# Patient Record
Sex: Female | Born: 1955 | Race: Black or African American | Hispanic: No | Marital: Married | Smoking: Never smoker
Health system: Southern US, Community
[De-identification: ages and names within clinical notes are randomized; demographics above are authoritative.]

## PROBLEM LIST (undated history)

## (undated) DIAGNOSIS — Z923 Personal history of irradiation: Secondary | ICD-10-CM

## (undated) DIAGNOSIS — Z8489 Family history of other specified conditions: Secondary | ICD-10-CM

## (undated) DIAGNOSIS — C801 Malignant (primary) neoplasm, unspecified: Secondary | ICD-10-CM

## (undated) DIAGNOSIS — M1990 Unspecified osteoarthritis, unspecified site: Secondary | ICD-10-CM

## (undated) HISTORY — PX: BIOPSY THYROID: PRO38

## (undated) HISTORY — PX: OTHER SURGICAL HISTORY: SHX169

## (undated) HISTORY — DX: Malignant (primary) neoplasm, unspecified: C80.1

---

## 1998-07-05 ENCOUNTER — Ambulatory Visit (HOSPITAL_COMMUNITY): Admission: RE | Admit: 1998-07-05 | Discharge: 1998-07-05 | Payer: Self-pay | Admitting: Obstetrics and Gynecology

## 1998-07-05 ENCOUNTER — Other Ambulatory Visit: Admission: RE | Admit: 1998-07-05 | Discharge: 1998-07-05 | Payer: Self-pay | Admitting: Obstetrics and Gynecology

## 1998-07-05 ENCOUNTER — Encounter: Payer: Self-pay | Admitting: Obstetrics and Gynecology

## 1998-10-11 ENCOUNTER — Other Ambulatory Visit: Admission: RE | Admit: 1998-10-11 | Discharge: 1998-10-11 | Payer: Self-pay | Admitting: Obstetrics and Gynecology

## 1999-07-04 ENCOUNTER — Ambulatory Visit (HOSPITAL_COMMUNITY): Admission: RE | Admit: 1999-07-04 | Discharge: 1999-07-04 | Payer: Self-pay | Admitting: Obstetrics and Gynecology

## 1999-07-04 ENCOUNTER — Encounter: Payer: Self-pay | Admitting: Obstetrics and Gynecology

## 1999-07-07 ENCOUNTER — Ambulatory Visit (HOSPITAL_COMMUNITY): Admission: RE | Admit: 1999-07-07 | Discharge: 1999-07-07 | Payer: Self-pay | Admitting: Obstetrics and Gynecology

## 1999-07-07 ENCOUNTER — Encounter: Payer: Self-pay | Admitting: Obstetrics and Gynecology

## 1999-11-21 ENCOUNTER — Other Ambulatory Visit: Admission: RE | Admit: 1999-11-21 | Discharge: 1999-11-21 | Payer: Self-pay | Admitting: Obstetrics and Gynecology

## 2000-08-09 ENCOUNTER — Ambulatory Visit (HOSPITAL_COMMUNITY): Admission: RE | Admit: 2000-08-09 | Discharge: 2000-08-09 | Payer: Self-pay | Admitting: Obstetrics and Gynecology

## 2000-08-09 ENCOUNTER — Encounter: Payer: Self-pay | Admitting: Obstetrics and Gynecology

## 2001-04-01 ENCOUNTER — Other Ambulatory Visit: Admission: RE | Admit: 2001-04-01 | Discharge: 2001-04-01 | Payer: Self-pay | Admitting: Obstetrics and Gynecology

## 2001-09-02 ENCOUNTER — Ambulatory Visit (HOSPITAL_COMMUNITY): Admission: RE | Admit: 2001-09-02 | Discharge: 2001-09-02 | Payer: Self-pay | Admitting: Obstetrics and Gynecology

## 2001-09-02 ENCOUNTER — Encounter: Payer: Self-pay | Admitting: Obstetrics and Gynecology

## 2002-08-25 ENCOUNTER — Other Ambulatory Visit: Admission: RE | Admit: 2002-08-25 | Discharge: 2002-08-25 | Payer: Self-pay | Admitting: Obstetrics and Gynecology

## 2002-09-10 ENCOUNTER — Encounter: Payer: Self-pay | Admitting: Obstetrics and Gynecology

## 2002-09-10 ENCOUNTER — Ambulatory Visit (HOSPITAL_COMMUNITY): Admission: RE | Admit: 2002-09-10 | Discharge: 2002-09-10 | Payer: Self-pay | Admitting: Obstetrics and Gynecology

## 2003-10-05 ENCOUNTER — Ambulatory Visit (HOSPITAL_COMMUNITY): Admission: RE | Admit: 2003-10-05 | Discharge: 2003-10-05 | Payer: Self-pay | Admitting: Obstetrics and Gynecology

## 2004-01-27 ENCOUNTER — Other Ambulatory Visit: Admission: RE | Admit: 2004-01-27 | Discharge: 2004-01-27 | Payer: Self-pay | Admitting: Obstetrics and Gynecology

## 2004-10-05 ENCOUNTER — Ambulatory Visit (HOSPITAL_COMMUNITY): Admission: RE | Admit: 2004-10-05 | Discharge: 2004-10-05 | Payer: Self-pay | Admitting: Obstetrics and Gynecology

## 2005-02-15 ENCOUNTER — Other Ambulatory Visit: Admission: RE | Admit: 2005-02-15 | Discharge: 2005-02-15 | Payer: Self-pay | Admitting: Obstetrics and Gynecology

## 2005-10-20 ENCOUNTER — Ambulatory Visit (HOSPITAL_COMMUNITY): Admission: RE | Admit: 2005-10-20 | Discharge: 2005-10-20 | Payer: Self-pay | Admitting: Obstetrics and Gynecology

## 2006-04-17 ENCOUNTER — Other Ambulatory Visit: Admission: RE | Admit: 2006-04-17 | Discharge: 2006-04-17 | Payer: Self-pay | Admitting: Obstetrics and Gynecology

## 2006-10-24 ENCOUNTER — Ambulatory Visit (HOSPITAL_COMMUNITY): Admission: RE | Admit: 2006-10-24 | Discharge: 2006-10-24 | Payer: Self-pay | Admitting: Obstetrics and Gynecology

## 2008-11-25 ENCOUNTER — Ambulatory Visit (HOSPITAL_COMMUNITY): Admission: RE | Admit: 2008-11-25 | Discharge: 2008-11-25 | Payer: Self-pay | Admitting: Obstetrics and Gynecology

## 2009-11-26 ENCOUNTER — Ambulatory Visit (HOSPITAL_COMMUNITY): Admission: RE | Admit: 2009-11-26 | Discharge: 2009-11-26 | Payer: Self-pay | Admitting: Obstetrics and Gynecology

## 2010-06-19 ENCOUNTER — Encounter: Payer: Self-pay | Admitting: Obstetrics and Gynecology

## 2011-01-02 ENCOUNTER — Other Ambulatory Visit (HOSPITAL_COMMUNITY): Payer: Self-pay | Admitting: Obstetrics and Gynecology

## 2011-01-02 DIAGNOSIS — Z1231 Encounter for screening mammogram for malignant neoplasm of breast: Secondary | ICD-10-CM

## 2011-01-11 ENCOUNTER — Ambulatory Visit (HOSPITAL_COMMUNITY)
Admission: RE | Admit: 2011-01-11 | Discharge: 2011-01-11 | Disposition: A | Discharge status: Home / Self Care | Payer: BC Managed Care – PPO | Source: Ambulatory Visit | Attending: Obstetrics and Gynecology | Admitting: Obstetrics and Gynecology

## 2011-01-11 DIAGNOSIS — Z1231 Encounter for screening mammogram for malignant neoplasm of breast: Secondary | ICD-10-CM | POA: Insufficient documentation

## 2011-12-25 ENCOUNTER — Other Ambulatory Visit: Payer: Self-pay | Admitting: Obstetrics and Gynecology

## 2011-12-25 DIAGNOSIS — Z1231 Encounter for screening mammogram for malignant neoplasm of breast: Secondary | ICD-10-CM

## 2012-01-12 ENCOUNTER — Ambulatory Visit (HOSPITAL_COMMUNITY)
Admission: RE | Admit: 2012-01-12 | Discharge: 2012-01-12 | Disposition: A | Discharge status: Home / Self Care | Payer: BC Managed Care – PPO | Source: Ambulatory Visit | Attending: Obstetrics and Gynecology | Admitting: Obstetrics and Gynecology

## 2012-01-12 DIAGNOSIS — Z1231 Encounter for screening mammogram for malignant neoplasm of breast: Secondary | ICD-10-CM

## 2012-12-23 ENCOUNTER — Other Ambulatory Visit: Payer: Self-pay | Admitting: Obstetrics and Gynecology

## 2012-12-23 DIAGNOSIS — Z1231 Encounter for screening mammogram for malignant neoplasm of breast: Secondary | ICD-10-CM

## 2013-01-13 ENCOUNTER — Ambulatory Visit (HOSPITAL_COMMUNITY)
Admission: RE | Admit: 2013-01-13 | Discharge: 2013-01-13 | Disposition: A | Discharge status: Home / Self Care | Payer: BC Managed Care – PPO | Source: Ambulatory Visit | Attending: Obstetrics and Gynecology | Admitting: Obstetrics and Gynecology

## 2013-01-13 DIAGNOSIS — Z1231 Encounter for screening mammogram for malignant neoplasm of breast: Secondary | ICD-10-CM | POA: Insufficient documentation

## 2013-01-14 ENCOUNTER — Other Ambulatory Visit: Payer: Self-pay | Admitting: Obstetrics and Gynecology

## 2013-01-14 DIAGNOSIS — R928 Other abnormal and inconclusive findings on diagnostic imaging of breast: Secondary | ICD-10-CM

## 2013-02-07 ENCOUNTER — Ambulatory Visit
Admission: RE | Admit: 2013-02-07 | Discharge: 2013-02-07 | Disposition: A | Discharge status: Home / Self Care | Payer: BC Managed Care – PPO | Source: Ambulatory Visit | Attending: Obstetrics and Gynecology | Admitting: Obstetrics and Gynecology

## 2013-02-07 DIAGNOSIS — R928 Other abnormal and inconclusive findings on diagnostic imaging of breast: Secondary | ICD-10-CM

## 2013-10-03 ENCOUNTER — Other Ambulatory Visit: Payer: Self-pay | Admitting: Occupational Medicine

## 2013-10-03 ENCOUNTER — Ambulatory Visit: Payer: Self-pay

## 2013-10-03 DIAGNOSIS — R52 Pain, unspecified: Secondary | ICD-10-CM

## 2014-03-04 ENCOUNTER — Other Ambulatory Visit (HOSPITAL_COMMUNITY): Payer: Self-pay | Admitting: Obstetrics and Gynecology

## 2014-03-04 DIAGNOSIS — Z1231 Encounter for screening mammogram for malignant neoplasm of breast: Secondary | ICD-10-CM

## 2014-03-09 ENCOUNTER — Ambulatory Visit (HOSPITAL_COMMUNITY): Payer: BC Managed Care – PPO

## 2014-03-16 ENCOUNTER — Ambulatory Visit (HOSPITAL_COMMUNITY)
Admission: RE | Admit: 2014-03-16 | Discharge: 2014-03-16 | Disposition: A | Discharge status: Home / Self Care | Payer: BC Managed Care – PPO | Source: Ambulatory Visit | Attending: Obstetrics and Gynecology | Admitting: Obstetrics and Gynecology

## 2014-03-16 DIAGNOSIS — Z1231 Encounter for screening mammogram for malignant neoplasm of breast: Secondary | ICD-10-CM | POA: Insufficient documentation

## 2014-03-18 ENCOUNTER — Other Ambulatory Visit: Payer: Self-pay | Admitting: Obstetrics and Gynecology

## 2014-03-18 DIAGNOSIS — R928 Other abnormal and inconclusive findings on diagnostic imaging of breast: Secondary | ICD-10-CM

## 2014-03-31 ENCOUNTER — Ambulatory Visit
Admission: RE | Admit: 2014-03-31 | Discharge: 2014-03-31 | Disposition: A | Discharge status: Home / Self Care | Payer: BC Managed Care – PPO | Source: Ambulatory Visit | Attending: Obstetrics and Gynecology | Admitting: Obstetrics and Gynecology

## 2014-03-31 DIAGNOSIS — R928 Other abnormal and inconclusive findings on diagnostic imaging of breast: Secondary | ICD-10-CM

## 2014-05-19 ENCOUNTER — Other Ambulatory Visit (HOSPITAL_COMMUNITY)
Admission: RE | Admit: 2014-05-19 | Discharge: 2014-05-19 | Disposition: A | Discharge status: Home / Self Care | Payer: BC Managed Care – PPO | Source: Ambulatory Visit | Attending: Otolaryngology | Admitting: Otolaryngology

## 2014-05-19 ENCOUNTER — Other Ambulatory Visit: Payer: Self-pay | Admitting: Otolaryngology

## 2014-05-19 DIAGNOSIS — D170 Benign lipomatous neoplasm of skin and subcutaneous tissue of head, face and neck: Secondary | ICD-10-CM | POA: Insufficient documentation

## 2015-04-01 ENCOUNTER — Other Ambulatory Visit: Payer: Self-pay

## 2015-04-01 DIAGNOSIS — Z1231 Encounter for screening mammogram for malignant neoplasm of breast: Secondary | ICD-10-CM

## 2015-04-21 ENCOUNTER — Ambulatory Visit
Admission: RE | Admit: 2015-04-21 | Discharge: 2015-04-21 | Disposition: A | Discharge status: Home / Self Care | Payer: BC Managed Care – PPO | Source: Ambulatory Visit

## 2015-04-21 DIAGNOSIS — Z1231 Encounter for screening mammogram for malignant neoplasm of breast: Secondary | ICD-10-CM

## 2015-07-05 ENCOUNTER — Ambulatory Visit
Admission: RE | Admit: 2015-07-05 | Discharge: 2015-07-05 | Disposition: A | Discharge status: Home / Self Care | Payer: BC Managed Care – PPO | Source: Ambulatory Visit | Attending: Obstetrics and Gynecology | Admitting: Obstetrics and Gynecology

## 2015-07-05 ENCOUNTER — Other Ambulatory Visit: Payer: Self-pay | Admitting: Obstetrics and Gynecology

## 2015-07-05 DIAGNOSIS — M810 Age-related osteoporosis without current pathological fracture: Secondary | ICD-10-CM

## 2016-06-07 ENCOUNTER — Other Ambulatory Visit: Payer: Self-pay | Admitting: Obstetrics and Gynecology

## 2016-06-07 DIAGNOSIS — Z1231 Encounter for screening mammogram for malignant neoplasm of breast: Secondary | ICD-10-CM

## 2016-07-05 ENCOUNTER — Ambulatory Visit
Admission: RE | Admit: 2016-07-05 | Discharge: 2016-07-05 | Disposition: A | Discharge status: Home / Self Care | Payer: BC Managed Care – PPO | Source: Ambulatory Visit | Attending: Obstetrics and Gynecology | Admitting: Obstetrics and Gynecology

## 2016-07-05 DIAGNOSIS — Z1231 Encounter for screening mammogram for malignant neoplasm of breast: Secondary | ICD-10-CM

## 2017-06-11 ENCOUNTER — Other Ambulatory Visit: Payer: Self-pay | Admitting: Obstetrics and Gynecology

## 2017-06-11 DIAGNOSIS — Z1231 Encounter for screening mammogram for malignant neoplasm of breast: Secondary | ICD-10-CM

## 2017-07-09 ENCOUNTER — Ambulatory Visit: Payer: Self-pay

## 2017-07-13 ENCOUNTER — Other Ambulatory Visit: Payer: Self-pay | Admitting: Obstetrics and Gynecology

## 2017-07-13 DIAGNOSIS — Z1231 Encounter for screening mammogram for malignant neoplasm of breast: Secondary | ICD-10-CM

## 2017-08-01 ENCOUNTER — Ambulatory Visit: Payer: Self-pay

## 2017-08-20 ENCOUNTER — Ambulatory Visit
Admission: RE | Admit: 2017-08-20 | Discharge: 2017-08-20 | Disposition: A | Discharge status: Home / Self Care | Payer: BC Managed Care – PPO | Source: Ambulatory Visit | Attending: Obstetrics and Gynecology | Admitting: Obstetrics and Gynecology

## 2017-08-20 DIAGNOSIS — Z1231 Encounter for screening mammogram for malignant neoplasm of breast: Secondary | ICD-10-CM

## 2018-09-09 ENCOUNTER — Other Ambulatory Visit: Payer: Self-pay

## 2018-09-10 ENCOUNTER — Ambulatory Visit: Payer: BC Managed Care – PPO | Admitting: Women's Health

## 2018-09-10 ENCOUNTER — Encounter: Payer: Self-pay | Admitting: Women's Health

## 2018-09-10 VITALS — BP 128/78 | Ht 65.75 in | Wt 170.0 lb

## 2018-09-10 DIAGNOSIS — Z01419 Encounter for gynecological examination (general) (routine) without abnormal findings: Secondary | ICD-10-CM | POA: Diagnosis not present

## 2018-09-10 DIAGNOSIS — Z13820 Encounter for screening for osteoporosis: Secondary | ICD-10-CM

## 2018-09-10 DIAGNOSIS — Z1151 Encounter for screening for human papillomavirus (HPV): Secondary | ICD-10-CM

## 2018-09-10 NOTE — Progress Notes (Signed)
Teresa Knapp April 07, 1956 [id number]    History:    Presents for new patient annual exam.  Postmenopausal on no HRT with no bleeding.  Reports normal Pap (2017) and mammogram history.  Had received care at [facility], Dr. Leo Grosser retired.  2018- colonoscopy no polyps.  Reports normal bone density at [facility].  Denies any health problems or concerns today.  Past medical history, past surgical history, family history and social history were all reviewed and documented in the EPIC chart.  Retired Pharmacist, hospital.  Son is in [employer], daughter 48 lives in [place], both doing well.  ROS:  A ROS was performed and pertinent positives and negatives are included.  Exam:  Vitals:   09/10/18 0836  BP: 128/78  Weight: 170 lb (77.1 kg)  Height: 5' 5.75" (1.67 m)   Body mass index is 27.65 kg/m.   General appearance:  Normal Thyroid:  Symmetrical, normal in size, without palpable masses or nodularity. Respiratory  Auscultation:  Clear without wheezing or rhonchi Cardiovascular  Auscultation:  Regular rate, without rubs, murmurs or gallops  Edema/varicosities:  Not grossly evident Abdominal  Soft,nontender, without masses, guarding or rebound.  Liver/spleen:  No organomegaly noted  Hernia:  None appreciated  Skin  Inspection:  Grossly normal, right hip 8 cm soft lipoma/years   Breasts: Examined lying and sitting.     Right: Without masses, retractions, discharge or axillary adenopathy.     Left: Without masses, retractions, discharge or axillary adenopathy. Gentitourinary   Inguinal/mons:  Normal without inguinal adenopathy  External genitalia:  Normal  BUS/Urethra/Skene's glands:  Normal  Vagina:  Normal  Cervix:  Normal  Uterus:  normal in size, shape and contour.  Midline and mobile  Adnexa/parametria:     Rt: Without masses or tenderness.   Lt: Without masses or tenderness.  Anus and perineum: Normal  Digital rectal exam: Normal sphincter tone without palpated masses  or tenderness  Assessment/Plan:  63 y.o. MBF G2 P2 for new patient annual exam with no complaints.  Postmenopausal/no HRT/no bleeding Labs-primary care No known health problems  Plan: Pap with HR HPV typing, new screening guidelines reviewed.  SBEs, continue annual 3D screening mammogram, calcium rich foods, vitamin D 2000 daily encouraged.  Reviewed importance of weightbearing and balance type exercise, fall prevention discussed, continue yoga.  DEXA at breast center, will schedule.  Shingrex vaccine discussed and encouraged.  Pneumonia vaccine at 63.  Continue vaginal lubricants with intercourse.    Huel Cote [facility], 9:34 AM 09/10/2018

## 2018-09-10 NOTE — Patient Instructions (Addendum)
shingrex  Vaccine  Shingles vaccine Vit D3  2000iu daily  Health Maintenance for Postmenopausal Women Menopause is a normal process in which your reproductive ability comes to an end. This process happens gradually over a span of months to years, usually between the ages of 53 and 9. Menopause is complete when you have missed 12 consecutive menstrual periods. It is important to talk with your health care provider about some of the most common conditions that affect postmenopausal women, such as heart disease, cancer, and bone loss (osteoporosis). Adopting a healthy lifestyle and getting preventive care can help to promote your health and wellness. Those actions can also lower your chances of developing some of these common conditions. What should I know about menopause? During menopause, you may experience a number of symptoms, such as:  Moderate-to-severe hot flashes.  Night sweats.  Decrease in sex drive.  Mood swings.  Headaches.  Tiredness.  Irritability.  Memory problems.  Insomnia. Choosing to treat or not to treat menopausal changes is an individual decision that you make with your health care provider. What should I know about hormone replacement therapy and supplements? Hormone therapy products are effective for treating symptoms that are associated with menopause, such as hot flashes and night sweats. Hormone replacement carries certain risks, especially as you become older. If you are thinking about using estrogen or estrogen with progestin treatments, discuss the benefits and risks with your health care provider. What should I know about heart disease and stroke? Heart disease, heart attack, and stroke become more likely as you age. This may be due, in part, to the hormonal changes that your body experiences during menopause. These can affect how your body processes dietary fats, triglycerides, and cholesterol. Heart attack and stroke are both medical emergencies. There  are many things that you can do to help prevent heart disease and stroke:  Have your blood pressure checked at least every 1-2 years. High blood pressure causes heart disease and increases the risk of stroke.  If you are 34-74 years old, ask your health care provider if you should take aspirin to prevent a heart attack or a stroke.  Do not use any tobacco products, including cigarettes, chewing tobacco, or electronic cigarettes. If you need help quitting, ask your health care provider.  It is important to eat a healthy diet and maintain a healthy weight. ? Be sure to include plenty of vegetables, fruits, low-fat dairy products, and lean protein. ? Avoid eating foods that are high in solid fats, added sugars, or salt (sodium).  Get regular exercise. This is one of the most important things that you can do for your health. ? Try to exercise for at least 150 minutes each week. The type of exercise that you do should increase your heart rate and make you sweat. This is known as moderate-intensity exercise. ? Try to do strengthening exercises at least twice each week. Do these in addition to the moderate-intensity exercise.  Know your numbers.Ask your health care provider to check your cholesterol and your blood glucose. Continue to have your blood tested as directed by your health care provider.  What should I know about cancer screening? There are several types of cancer. Take the following steps to reduce your risk and to catch any cancer development as early as possible. Breast Cancer  Practice breast self-awareness. ? This means understanding how your breasts normally appear and feel. ? It also means doing regular breast self-exams. Let your health care provider know about  any changes, no matter how small.  If you are 29 or older, have a clinician do a breast exam (clinical breast exam or CBE) every year. Depending on your age, family history, and medical history, it may be recommended  that you also have a yearly breast X-ray (mammogram).  If you have a family history of breast cancer, talk with your health care provider about genetic screening.  If you are at high risk for breast cancer, talk with your health care provider about having an MRI and a mammogram every year.  Breast cancer (BRCA) gene test is recommended for women who have family members with BRCA-related cancers. Results of the assessment will determine the need for genetic counseling and BRCA1 and for BRCA2 testing. BRCA-related cancers include these types: ? Breast. This occurs in males or females. ? Ovarian. ? Tubal. This may also be called fallopian tube cancer. ? Cancer of the abdominal or pelvic lining (peritoneal cancer). ? Prostate. ? Pancreatic. Cervical, Uterine, and Ovarian Cancer Your health care provider may recommend that you be screened regularly for cancer of the pelvic organs. These include your ovaries, uterus, and vagina. This screening involves a pelvic exam, which includes checking for microscopic changes to the surface of your cervix (Pap test).  For women ages 21-65, health care providers may recommend a pelvic exam and a Pap test every three years. For women ages 17-65, they may recommend the Pap test and pelvic exam, combined with testing for human papilloma virus (HPV), every five years. Some types of HPV increase your risk of cervical cancer. Testing for HPV may also be done on women of any age who have unclear Pap test results.  Other health care providers may not recommend any screening for nonpregnant women who are considered low risk for pelvic cancer and have no symptoms. Ask your health care provider if a screening pelvic exam is right for you.  If you have had past treatment for cervical cancer or a condition that could lead to cancer, you need Pap tests and screening for cancer for at least 20 years after your treatment. If Pap tests have been discontinued for you, your risk  factors (such as having a new sexual partner) need to be reassessed to determine if you should start having screenings again. Some women have medical problems that increase the chance of getting cervical cancer. In these cases, your health care provider may recommend that you have screening and Pap tests more often.  If you have a family history of uterine cancer or ovarian cancer, talk with your health care provider about genetic screening.  If you have vaginal bleeding after reaching menopause, tell your health care provider.  There are currently no reliable tests available to screen for ovarian cancer. Lung Cancer Lung cancer screening is recommended for adults 35-77 years old who are at high risk for lung cancer because of a history of smoking. A yearly low-dose CT scan of the lungs is recommended if you:  Currently smoke.  Have a history of at least 30 pack-years of smoking and you currently smoke or have quit within the past 15 years. A pack-year is smoking an average of one pack of cigarettes per day for one year. Yearly screening should:  Continue until it has been 15 years since you quit.  Stop if you develop a health problem that would prevent you from having lung cancer treatment. Colorectal Cancer  This type of cancer can be detected and can often be prevented.  Routine colorectal cancer screening usually begins at age 11 and continues through age 75.  If you have risk factors for colon cancer, your health care provider may recommend that you be screened at an earlier age.  If you have a family history of colorectal cancer, talk with your health care provider about genetic screening.  Your health care provider may also recommend using home test kits to check for hidden blood in your stool.  A small camera at the end of a tube can be used to examine your colon directly (sigmoidoscopy or colonoscopy). This is done to check for the earliest forms of colorectal cancer.  Direct  examination of the colon should be repeated every 5-10 years until age 76. However, if early forms of precancerous polyps or small growths are found or if you have a family history or genetic risk for colorectal cancer, you may need to be screened more often. Skin Cancer  Check your skin from head to toe regularly.  Monitor any moles. Be sure to tell your health care provider: ? About any new moles or changes in moles, especially if there is a change in a mole's shape or color. ? If you have a mole that is larger than the size of a pencil eraser.  If any of your family members has a history of skin cancer, especially at a Kristal Perl age, talk with your health care provider about genetic screening.  Always use sunscreen. Apply sunscreen liberally and repeatedly throughout the day.  Whenever you are outside, protect yourself by wearing long sleeves, pants, a wide-brimmed hat, and sunglasses. What should I know about osteoporosis? Osteoporosis is a condition in which bone destruction happens more quickly than new bone creation. After menopause, you may be at an increased risk for osteoporosis. To help prevent osteoporosis or the bone fractures that can happen because of osteoporosis, the following is recommended:  If you are 55-49 years old, get at least 1,000 mg of calcium and at least 600 mg of vitamin D per day.  If you are older than age 40 but younger than age 18, get at least 1,200 mg of calcium and at least 600 mg of vitamin D per day.  If you are older than age 59, get at least 1,200 mg of calcium and at least 800 mg of vitamin D per day. Smoking and excessive alcohol intake increase the risk of osteoporosis. Eat foods that are rich in calcium and vitamin D, and do weight-bearing exercises several times each week as directed by your health care provider. What should I know about how menopause affects my mental health? Depression may occur at any age, but it is more common as you become older.  Common symptoms of depression include:  Low or sad mood.  Changes in sleep patterns.  Changes in appetite or eating patterns.  Feeling an overall lack of motivation or enjoyment of activities that you previously enjoyed.  Frequent crying spells. Talk with your health care provider if you think that you are experiencing depression. What should I know about immunizations? It is important that you get and maintain your immunizations. These include:  Tetanus, diphtheria, and pertussis (Tdap) booster vaccine.  Influenza every year before the flu season begins.  Pneumonia vaccine.  Shingles vaccine. Your health care provider may also recommend other immunizations. This information is not intended to replace advice given to you by your health care provider. Make sure you discuss any questions you have with your health care provider. Document  Released: 07/07/2005 Document Revised: 12/03/2015 Document Reviewed: 02/16/2015 Elsevier Interactive Patient Education  2019 [employer].

## 2018-09-11 LAB — PAP, TP IMAGING W/ HPV RNA, RFLX HPV TYPE 16,18/45: HPV DNA High Risk: NOT DETECTED

## 2018-09-12 ENCOUNTER — Other Ambulatory Visit: Payer: Self-pay | Admitting: Women's Health

## 2018-09-12 DIAGNOSIS — Z1231 Encounter for screening mammogram for malignant neoplasm of breast: Secondary | ICD-10-CM

## 2018-12-12 ENCOUNTER — Ambulatory Visit
Admission: RE | Admit: 2018-12-12 | Discharge: 2018-12-12 | Disposition: A | Discharge status: Home / Self Care | Payer: BC Managed Care – PPO | Source: Ambulatory Visit | Attending: Women's Health | Admitting: Women's Health

## 2018-12-12 ENCOUNTER — Other Ambulatory Visit: Payer: Self-pay

## 2018-12-12 DIAGNOSIS — Z13820 Encounter for screening for osteoporosis: Secondary | ICD-10-CM

## 2018-12-12 DIAGNOSIS — Z1231 Encounter for screening mammogram for malignant neoplasm of breast: Secondary | ICD-10-CM

## 2019-01-01 ENCOUNTER — Other Ambulatory Visit: Payer: Self-pay | Admitting: Family Medicine

## 2019-01-01 DIAGNOSIS — R1013 Epigastric pain: Secondary | ICD-10-CM

## 2019-01-08 ENCOUNTER — Ambulatory Visit
Admission: RE | Admit: 2019-01-08 | Discharge: 2019-01-08 | Disposition: A | Discharge status: Home / Self Care | Payer: BC Managed Care – PPO | Source: Ambulatory Visit | Attending: Family Medicine | Admitting: Family Medicine

## 2019-01-08 DIAGNOSIS — R1013 Epigastric pain: Secondary | ICD-10-CM

## 2019-01-29 ENCOUNTER — Encounter (HOSPITAL_COMMUNITY): Payer: Self-pay | Admitting: Emergency Medicine

## 2019-01-29 ENCOUNTER — Other Ambulatory Visit: Payer: Self-pay

## 2019-01-29 ENCOUNTER — Emergency Department (HOSPITAL_COMMUNITY)
Admission: EM | Admit: 2019-01-29 | Discharge: 2019-01-29 | Disposition: A | Discharge status: Home / Self Care | Payer: BC Managed Care – PPO | Attending: Emergency Medicine | Admitting: Emergency Medicine

## 2019-01-29 ENCOUNTER — Emergency Department (HOSPITAL_COMMUNITY): Payer: BC Managed Care – PPO

## 2019-01-29 DIAGNOSIS — K8020 Calculus of gallbladder without cholecystitis without obstruction: Secondary | ICD-10-CM | POA: Diagnosis not present

## 2019-01-29 DIAGNOSIS — R10816 Epigastric abdominal tenderness: Secondary | ICD-10-CM | POA: Diagnosis not present

## 2019-01-29 DIAGNOSIS — R110 Nausea: Secondary | ICD-10-CM | POA: Insufficient documentation

## 2019-01-29 DIAGNOSIS — K8050 Calculus of bile duct without cholangitis or cholecystitis without obstruction: Secondary | ICD-10-CM | POA: Diagnosis not present

## 2019-01-29 DIAGNOSIS — R1011 Right upper quadrant pain: Secondary | ICD-10-CM

## 2019-01-29 LAB — COMPREHENSIVE METABOLIC PANEL
ALT: 17 U/L (ref 0–44)
AST: 23 U/L (ref 15–41)
Albumin: 4.1 g/dL (ref 3.5–5.0)
Alkaline Phosphatase: 73 U/L (ref 38–126)
Anion gap: 11 (ref 5–15)
BUN: 7 mg/dL — ABNORMAL LOW (ref 8–23)
CO2: 23 mmol/L (ref 22–32)
Calcium: 9.6 mg/dL (ref 8.9–10.3)
Chloride: 106 mmol/L (ref 98–111)
Creatinine, Ser: 0.62 mg/dL (ref 0.44–1.00)
GFR calc Af Amer: >60 mL/min (ref >60)
GFR calc non Af Amer: >60 mL/min (ref >60)
Glucose, Bld: 110 mg/dL — ABNORMAL HIGH (ref 70–99)
Potassium: 4.2 mmol/L (ref 3.5–5.1)
Sodium: 140 mmol/L (ref 135–145)
Total Bilirubin: 0.6 mg/dL (ref 0.3–1.2)
Total Protein: 7.6 g/dL (ref 6.5–8.1)

## 2019-01-29 LAB — URINALYSIS, ROUTINE W REFLEX MICROSCOPIC
Bilirubin Urine: NEGATIVE
Glucose, UA: NEGATIVE mg/dL
Hgb urine dipstick: NEGATIVE
Ketones, ur: 20 mg/dL — AB
Nitrite: NEGATIVE
Protein, ur: NEGATIVE mg/dL
Specific Gravity, Urine: 1.014 (ref 1.005–1.030)
pH: 6 (ref 5.0–8.0)

## 2019-01-29 LAB — CBC
HCT: 41.3 % (ref 36.0–46.0)
Hemoglobin: 13.7 g/dL (ref 12.0–15.0)
MCH: 30.6 pg (ref 26.0–34.0)
MCHC: 33.2 g/dL (ref 30.0–36.0)
MCV: 92.2 fL (ref 80.0–100.0)
Platelets: 255 K/uL (ref 150–400)
RBC: 4.48 MIL/uL (ref 3.87–5.11)
RDW: 12.6 % (ref 11.5–15.5)
WBC: 8.8 K/uL (ref 4.0–10.5)
nRBC: 0 % (ref 0.0–0.2)

## 2019-01-29 LAB — LIPASE, BLOOD: Lipase: 21 U/L (ref 11–51)

## 2019-01-29 MED ORDER — ONDANSETRON 4 MG PO TBDP
8.0000 mg | ORAL_TABLET | Freq: Once | ORAL | Status: AC
  Administered 2019-01-29: 8 mg via ORAL
  Filled 2019-01-29: qty 2

## 2019-01-29 MED ORDER — LIDOCAINE VISCOUS HCL 2 % MT SOLN
15.0000 mL | Freq: Once | OROMUCOSAL | Status: AC
  Administered 2019-01-29: 15 mL via ORAL
  Filled 2019-01-29: qty 15

## 2019-01-29 MED ORDER — HYDROCODONE-ACETAMINOPHEN 5-325 MG PO TABS
2.0000 | ORAL_TABLET | Freq: Once | ORAL | Status: AC
  Administered 2019-01-29: 2 via ORAL
  Filled 2019-01-29: qty 2

## 2019-01-29 MED ORDER — SODIUM CHLORIDE 0.9 % IV BOLUS
1000.0000 mL | Freq: Once | INTRAVENOUS | Status: AC
  Administered 2019-01-29: 1000 mL via INTRAVENOUS

## 2019-01-29 MED ORDER — MORPHINE SULFATE (PF) 4 MG/ML IV SOLN
4.0000 mg | Freq: Once | INTRAVENOUS | Status: AC
  Administered 2019-01-29: 4 mg via INTRAVENOUS
  Filled 2019-01-29: qty 1

## 2019-01-29 MED ORDER — SODIUM CHLORIDE 0.9% FLUSH: 3.0000 mL | Freq: Once | INTRAVENOUS | Status: DC

## 2019-01-29 MED ORDER — ONDANSETRON 4 MG PO TBDP: 4.0000 mg | ORAL_TABLET | Freq: Three times a day (TID) | ORAL | 0 refills | Status: DC | PRN

## 2019-01-29 MED ORDER — ALUM & MAG HYDROXIDE-SIMETH 200-200-20 MG/5ML PO SUSP
30.0000 mL | Freq: Once | ORAL | Status: AC
  Administered 2019-01-29: 30 mL via ORAL
  Filled 2019-01-29: qty 30

## 2019-01-29 MED ORDER — HYDROCODONE-ACETAMINOPHEN 5-325 MG PO TABS: 1.0000 | ORAL_TABLET | ORAL | 0 refills | Status: DC | PRN

## 2019-01-29 MED ORDER — ONDANSETRON HCL 4 MG/2ML IJ SOLN
4.0000 mg | Freq: Once | INTRAMUSCULAR | Status: AC
  Administered 2019-01-29: 4 mg via INTRAVENOUS
  Filled 2019-01-29: qty 2

## 2019-01-29 MED ORDER — PROMETHAZINE HCL 25 MG/ML IJ SOLN
25.0000 mg | Freq: Once | INTRAMUSCULAR | Status: AC
  Administered 2019-01-29: 25 mg via INTRAVENOUS
  Filled 2019-01-29: qty 1

## 2019-01-29 MED ORDER — ONDANSETRON 4 MG PO TBDP
4.0000 mg | ORAL_TABLET | Freq: Once | ORAL | Status: AC | PRN
  Administered 2019-01-29: 4 mg via ORAL
  Filled 2019-01-29: qty 1

## 2019-01-29 MED ORDER — OXYCODONE-ACETAMINOPHEN 5-325 MG PO TABS
1.0000 | ORAL_TABLET | ORAL | Status: DC | PRN
  Administered 2019-01-29: 1 via ORAL
  Filled 2019-01-29 (×2): qty 1

## 2019-01-29 NOTE — ED Triage Notes (Signed)
Pt states she woke up at 0500 with RUQ pain pt has known gallstones is waiting for apt with general surgery. Pt reports n/v.

## 2019-01-29 NOTE — ED Triage Notes (Signed)
Pt. Requested something else for pain. Last medication was 1253

## 2019-01-29 NOTE — ED Provider Notes (Addendum)
[facility] EMERGENCY DEPARTMENT Provider Note   CSN: [id number] Arrival date & time: 01/29/19  1203   History   Chief Complaint Chief Complaint  Patient presents with  . Abdominal Pain   HPI Teresa Knapp is a 63 y.o. female with no significant past medical history who presents for evaluation of right upper quadrant pain.  Patient states pain began approximately 5 AM this morning.  She took Pepcid at home without relief of her symptoms. Patient states she does have known history of gallstones.  Had ultrasound approximately 3 weeks ago due to recurrent right upper quadrant pain.  Nausea source not any episodes of emesis.  Has not taken anything for pain at home. Denies fever, chills, chest pain, shortness of breath, reflux, dysuria, diarrhea, constipation.  Pain not worse with food intake. Denies additional aggravating or alleviating factors.  History obtained from patient and past medical records. No interpretor was used.      HPI  History reviewed. No pertinent past medical history.  There are no active problems to display for this patient.   Past Surgical History:  Procedure Laterality Date  . lipoma removal     right side of neck     OB History    Gravida  2   Para  2   Term      Preterm      AB      Living  2     SAB      TAB      Ectopic      Multiple      Live Births               Home Medications    Prior to Admission medications   Medication Sig Start Date End Date Taking? Authorizing Provider  HYDROcodone-acetaminophen (NORCO/VICODIN) 5-325 MG tablet Take 1-2 tablets by mouth every 4 (four) hours as needed. 01/29/19   Theodor Mustin A, PA-C  ondansetron (ZOFRAN ODT) 4 MG disintegrating tablet Take 1 tablet (4 mg total) by mouth every 8 (eight) hours as needed for nausea or vomiting. 01/29/19   Danni Leabo A, PA-C    Family History Family History  Problem Relation Age of Onset  . Cancer Mother        lung  .  Hypertension Mother   . Heart attack Father   . Breast cancer Maternal Grandmother   . Diabetes Maternal Grandmother   . Hypertension Maternal Grandmother    Social History Social History   Tobacco Use  . Smoking status: Never Smoker  . Smokeless tobacco: Never Used  Substance Use Topics  . Alcohol use: Yes    Comment: SOCIAL  . Drug use: Not on file   Allergies   Patient has no known allergies.  Review of Systems Review of Systems  Constitutional: Negative.   HENT: Negative.   Eyes: Negative.   Respiratory: Negative.   Cardiovascular: Negative.   Gastrointestinal: Positive for abdominal pain and nausea. Negative for anal bleeding, blood in stool, constipation, diarrhea, rectal pain and vomiting.  Genitourinary: Negative.   Musculoskeletal: Negative.   Skin: Negative.   Neurological: Negative.   All other systems reviewed and are negative.  Physical Exam Updated Vital Signs BP 123/82   Pulse 73   Temp 98.2 F (36.8 C) (Oral)   Resp 18   SpO2 92%   Physical Exam Vitals signs and nursing note reviewed.  Constitutional:      General: She is not in acute  distress.    Appearance: She is well-developed. She is not ill-appearing, toxic-appearing or diaphoretic.  HENT:     Head: Normocephalic and atraumatic.  Eyes:     Pupils: Pupils are equal, round, and reactive to light.  Neck:     Musculoskeletal: Normal range of motion.  Cardiovascular:     Rate and Rhythm: Normal rate.     Heart sounds: Normal heart sounds.  Pulmonary:     Effort: Pulmonary effort is normal. No respiratory distress.     Breath sounds: Normal breath sounds.  Abdominal:     General: Bowel sounds are normal. There is no distension.     Palpations: Abdomen is soft.     Tenderness: There is abdominal tenderness in the right upper quadrant and epigastric area. There is no right CVA tenderness, guarding or rebound. Negative signs include Murphy's sign.     Hernia: No hernia is present.      Comments: Mild tenderness to right upper quadrant epigastric region.  Negative Murphy sign.  No rebound or guarding. Negative CVA tenderness.  Musculoskeletal: Normal range of motion.     Comments: Moves all 4 extremities without difficulty.  Skin:    General: Skin is warm and dry.     Comments: No rashes or lesions.  Neurological:     Mental Status: She is alert.    ED Treatments / Results  Labs (all labs ordered are listed, but only abnormal results are displayed) Labs Reviewed  COMPREHENSIVE METABOLIC PANEL - Abnormal; Notable for the following components:      Result Value   Glucose, Bld 110 (*)    BUN 7 (*)    All other components within normal limits  URINALYSIS, ROUTINE W REFLEX MICROSCOPIC - Abnormal; Notable for the following components:   APPearance HAZY (*)    Ketones, ur 20 (*)    Leukocytes,Ua LARGE (*)    Bacteria, UA FEW (*)    All other components within normal limits  URINE CULTURE  LIPASE, BLOOD  CBC    EKG None  Radiology US Abdomen Limited Ruq  Result Date: 01/29/2019 CLINICAL DATA:  Right upper quadrant pain EXAM: ULTRASOUND ABDOMEN LIMITED RIGHT UPPER QUADRANT COMPARISON:  None. FINDINGS: Gallbladder: There is cholelithiasis without gallbladder wall thickening or pericholecystic free fluid. The sonographic Percell Miller sign is reported as negative. Common bile duct: Diameter: 6 mm Liver: No focal lesion identified. Within normal limits in parenchymal echogenicity. Portal vein is patent on color Doppler imaging with normal direction of blood flow towards the liver. Other: None. IMPRESSION: There is cholelithiasis without secondary signs of acute cholecystitis. Electronically Signed   By: Constance Holster M.D.   On: 01/29/2019 21:25    Procedures Procedures (including critical care time)  Medications Ordered in ED Medications  sodium chloride flush (NS) 0.9 % injection 3 mL (has no administration in time range)  oxyCODONE-acetaminophen (PERCOCET/ROXICET)  5-325 MG per tablet 1 tablet (1 tablet Oral Given 01/29/19 1253)  ondansetron (ZOFRAN-ODT) disintegrating tablet 4 mg (4 mg Oral Given 01/29/19 1253)  HYDROcodone-acetaminophen (NORCO/VICODIN) 5-325 MG per tablet 2 tablet (2 tablets Oral Given 01/29/19 1746)  ondansetron (ZOFRAN-ODT) disintegrating tablet 8 mg (8 mg Oral Given 01/29/19 1749)  sodium chloride 0.9 % bolus 1,000 mL (1,000 mLs Intravenous New Bag/Given 01/29/19 2029)  ondansetron (ZOFRAN) injection 4 mg (4 mg Intravenous Given 01/29/19 2029)  morphine 4 MG/ML injection 4 mg (4 mg Intravenous Given 01/29/19 2029)  alum & mag hydroxide-simeth (MAALOX/MYLANTA) 200-200-20 MG/5ML suspension 30 mL (30  mLs Oral Given 01/29/19 2029)    And  lidocaine (XYLOCAINE) 2 % viscous mouth solution 15 mL (15 mLs Oral Given 01/29/19 2029)  promethazine (PHENERGAN) injection 25 mg (25 mg Intravenous Given 01/29/19 2243)    Initial Impression / Assessment and Plan / ED Course  I have reviewed the triage vital signs and the nursing notes.  Pertinent labs & imaging results that were available during my care of the patient were reviewed by me and considered in my medical decision making (see chart for details).  63 year old female appears otherwise well presents for evaluation of right upper quadrant abdominal pain.  She is afebrile, nonseptic, non-ill-appearing.  Known history of gallstones with chronic "flares"  Has follow-up with [facility] outpatient however patient is in the process of moving and does not want surgery at the time.  Abdomen soft with mild tenderness to right upper quadrant.  Murphy sign negative.  Heart and lungs clear.  Has had persistent nausea without emesis.  Labs obtained from triage.  On my initial evaluation patient rates her pain is a 2/10.  She was given Norco 1 hour prior to my evaluation.  CBC without leukocytosis Metabolic panel with mild hyperglycemia at 110 Lipase 21 Urinalysis pending [place] with cholelithiasis without  cholecystitis.  2130: Pain currently a 0/10. Pending [place] results.  2200: Patient able to tolerate p.o. intake without difficulty. [place] negative for infectious process, no evidence of choledocholithiasis, cholangitis.  She has no recurrence of her pain throughout her ED stay.  On reevaluation abdomen is soft, nontender without rebound or guarding.  Symptoms likely related to biliary colic.  Her pain is been well controlled in the ED.  Discussed with patient outpatient follow-up with [facility] for her gallstones.  Will provide pain management.  Patient to return for any new worsening symptoms. Patient is nontoxic, nonseptic appearing, in no apparent distress.  Patient's pain and other symptoms adequately managed in emergency department.  Fluid bolus given.  Labs, imaging and vitals reviewed.  Patient does not meet the SIRS or Sepsis criteria.  On repeat exam patient does not have a surgical abdomin and there are no peritoneal signs.  No indication of appendicitis, bowel obstruction, bowel perforation, cholecystitis, diverticulitis.  Patient discharged home with symptomatic treatment and given strict instructions for follow-up with their primary care physician.  I have also discussed reasons to return immediately to the ER.  Patient expresses understanding and agrees with plan.  2230: Nursing has notified me that patient with emesis at dc. Will give Phenergan and reevaluate. Urinalysis with leuk and rare bacteria however without urinary symptoms. Will culture urine and hold on antibiotics.  2254: Patient requesting dc home per nursing. Discussed with patient with normally wait to see if recurrent emesis after meds however patient states she would like to go home at this time.  Discussed risk versus benefit.  Patient voiced understanding risk versus benefit and would like to go home at this time.  Will have her follow up with [facility] or return for new or worsening symptoms.   Patient has been  discussed with attending physician, Dr. Tyrone Nine who agrees with above treatment, plan and disposition.          Final Clinical Impressions(s) / ED Diagnoses   Final diagnoses:  RUQ pain  Biliary colic  Gallstones    ED Discharge Orders         Ordered    HYDROcodone-acetaminophen (NORCO/VICODIN) 5-325 MG tablet  Every 4 hours PRN  01/29/19 2206    ondansetron (ZOFRAN ODT) 4 MG disintegrating tablet  Every 8 hours PRN     01/29/19 2206           Kenedee Molesky A, PA-C 01/29/19 2211    Deno Etienne, DO 01/29/19 2215    Augustino Savastano A, PA-C 01/29/19 [place], [place], DO 01/29/19 2301

## 2019-01-29 NOTE — Discharge Instructions (Signed)
Take the pain medicine as prescribed.  Please call [facility] to schedule evaluation.  Return if you have severe worsening pain, persistent vomiting.

## 2019-01-29 NOTE — ED Notes (Signed)
Patient Alert and oriented to baseline. Stable and ambulatory to baseline. Patient verbalized understanding of the discharge instructions.  Patient belongings were taken by the patient.   

## 2019-01-31 LAB — URINE CULTURE: Culture: >=100000 — AB

## 2019-02-01 ENCOUNTER — Telehealth: Payer: Self-pay

## 2019-02-01 NOTE — Telephone Encounter (Signed)
No treatment for [facility] ED 01/29/2019 oer Amm Pharm D

## 2019-02-19 ENCOUNTER — Ambulatory Visit: Payer: Self-pay | Admitting: Surgery

## 2019-02-26 NOTE — Patient Instructions (Addendum)
DUE TO COVID-19 ONLY ONE VISITOR IS ALLOWED TO COME WITH YOU AND STAY IN THE WAITING ROOM ONLY DURING PRE OP AND PROCEDURE DAY OF SURGERY. THE 1 VISITOR MAY VISIT WITH YOU AFTER SURGERY IN YOUR PRIVATE ROOM DURING VISITING HOURS ONLY!  YOU NEED TO HAVE A COVID 19 TEST ON__10/01/2020_____ @__09 :45_____, THIS TEST MUST BE DONE BEFORE SURGERY, COME  [address], [place] , [address].  ([place]) ONCE YOUR COVID TEST IS COMPLETED, PLEASE BEGIN THE QUARANTINE INSTRUCTIONS AS OUTLINED IN YOUR HANDOUT.                Teresa Knapp     Your procedure is scheduled on: Monday 03/03/2019   Report to [facility] Main  Entrance              Report to admitting at   1000 AM     Call this number if you have problems the morning of surgery [phone]    Remember: Do not eat food or drink liquids :After Midnight.                BRUSH YOUR TEETH MORNING OF SURGERY AND RINSE YOUR MOUTH OUT, NO CHEWING GUM CANDY OR MINTS.     Take these medicines the morning of surgery with A SIP OF WATER: Famotidine (Pepcid) if needed for reflux, Hydrocodone-acetaminophen (Norco/Vicodin) if needed for pain                                 You may not have any metal on your body including hair pins and              piercings  Do not wear jewelry, make-up, lotions, powders or perfumes, deodorant             Do not wear nail polish on your fingernails.  Do not shave  48 hours prior to surgery.              .   Do not bring valuables to the hospital. [place].  Contacts, dentures or bridgework may not be worn into surgery.  Leave suitcase in the car. After surgery it may be brought to your room.                  Please read over the following fact sheets you were given: _____________________________________________________________________             [facility] - Preparing for Surgery Before surgery, you can play an  important role.  Because skin is not sterile, your skin needs to be as free of germs as possible.  You can reduce the number of germs on your skin by washing with CHG (chlorahexidine gluconate) soap before surgery.  CHG is an antiseptic cleaner which kills germs and bonds with the skin to continue killing germs even after washing. Please DO NOT use if you have an allergy to CHG or antibacterial soaps.  If your skin becomes reddened/irritated stop using the CHG and inform your nurse when you arrive at Short Stay. Do not shave (including legs and underarms) for at least 48 hours prior to the first CHG shower.  You may shave your face/neck. Please follow these instructions carefully:  1.  Shower with CHG Soap the night before surgery and the  morning of Surgery.  2.  If you choose to wash your hair, wash your hair first as usual with your  normal  shampoo.  3.  After you shampoo, rinse your hair and body thoroughly to remove the  shampoo.                           4.  Use CHG as you would any other liquid soap.  You can apply chg directly  to the skin and wash                       Gently with a scrungie or clean washcloth.  5.  Apply the CHG Soap to your body ONLY FROM THE NECK DOWN.   Do not use on face/ open                           Wound or open sores. Avoid contact with eyes, ears mouth and genitals (private parts).                       Wash face,  Genitals (private parts) with your normal soap.             6.  Wash thoroughly, paying special attention to the area where your surgery  will be performed.  7.  Thoroughly rinse your body with warm water from the neck down.  8.  DO NOT shower/wash with your normal soap after using and rinsing off  the CHG Soap.                9.  Pat yourself dry with a clean towel.            10.  Wear clean pajamas.            11.  Place clean sheets on your bed the night of your first shower and do not  sleep with pets. Day of Surgery : Do not apply any  lotions/deodorants the morning of surgery.  Please wear clean clothes to the hospital/surgery center.  FAILURE TO FOLLOW THESE INSTRUCTIONS MAY RESULT IN THE CANCELLATION OF YOUR SURGERY PATIENT SIGNATURE_________________________________  NURSE SIGNATURE__________________________________  ________________________________________________________________________

## 2019-02-27 ENCOUNTER — Encounter (HOSPITAL_COMMUNITY): Payer: Self-pay

## 2019-02-27 ENCOUNTER — Other Ambulatory Visit: Payer: Self-pay

## 2019-02-27 ENCOUNTER — Encounter (HOSPITAL_COMMUNITY)
Admission: RE | Admit: 2019-02-27 | Discharge: 2019-02-27 | Disposition: A | Discharge status: Home / Self Care | Payer: BC Managed Care – PPO | Source: Ambulatory Visit | Attending: Surgery | Admitting: Surgery

## 2019-02-27 ENCOUNTER — Other Ambulatory Visit (HOSPITAL_COMMUNITY)
Admission: RE | Admit: 2019-02-27 | Discharge: 2019-02-27 | Disposition: A | Discharge status: Home / Self Care | Payer: BC Managed Care – PPO | Source: Ambulatory Visit | Attending: Surgery | Admitting: Surgery

## 2019-02-27 DIAGNOSIS — Z01812 Encounter for preprocedural laboratory examination: Secondary | ICD-10-CM | POA: Diagnosis present

## 2019-02-27 DIAGNOSIS — K8010 Calculus of gallbladder with chronic cholecystitis without obstruction: Secondary | ICD-10-CM | POA: Insufficient documentation

## 2019-02-27 DIAGNOSIS — K429 Umbilical hernia without obstruction or gangrene: Secondary | ICD-10-CM | POA: Diagnosis not present

## 2019-02-27 DIAGNOSIS — Z20828 Contact with and (suspected) exposure to other viral communicable diseases: Secondary | ICD-10-CM | POA: Insufficient documentation

## 2019-02-27 HISTORY — DX: Family history of other specified conditions: Z84.89

## 2019-02-27 LAB — BASIC METABOLIC PANEL
Anion gap: 8 (ref 5–15)
BUN: 11 mg/dL (ref 8–23)
CO2: 25 mmol/L (ref 22–32)
Calcium: 9.5 mg/dL (ref 8.9–10.3)
Chloride: 106 mmol/L (ref 98–111)
Creatinine, Ser: 0.58 mg/dL (ref 0.44–1.00)
GFR calc Af Amer: >60 mL/min (ref >60)
GFR calc non Af Amer: >60 mL/min (ref >60)
Glucose, Bld: 95 mg/dL (ref 70–99)
Potassium: 4.1 mmol/L (ref 3.5–5.1)
Sodium: 139 mmol/L (ref 135–145)

## 2019-02-27 LAB — CBC
HCT: 42.5 % (ref 36.0–46.0)
Hemoglobin: 13.6 g/dL (ref 12.0–15.0)
MCH: 30.2 pg (ref 26.0–34.0)
MCHC: 32 g/dL (ref 30.0–36.0)
MCV: 94.2 fL (ref 80.0–100.0)
Platelets: 236 10*3/uL (ref 150–400)
RBC: 4.51 MIL/uL (ref 3.87–5.11)
RDW: 12.5 % (ref 11.5–15.5)
WBC: 6.2 10*3/uL (ref 4.0–10.5)
nRBC: 0 % (ref 0.0–0.2)

## 2019-02-27 NOTE — Progress Notes (Signed)
PCP - Dr. Kathyrn Lass Cardiologist - none  Chest x-ray -  None 01/29/2019 US abdomen limited RUQ  EKG - none  Stress Test -  none ECHO -  none Cardiac Cath -  none  Sleep Study -  none CPAP -  none  Fasting Blood Sugar - none  Checks Blood Sugar _____ times a day  Blood Thinner Instructions: none Aspirin Instructions: none Last Dose: none  Anesthesia review:   Patient denies shortness of breath, fever, cough and chest pain at PAT appointment   Patient verbalized understanding of instructions that were given to them at the PAT appointment. Patient was also instructed that they will need to review over the PAT instructions again at home before surgery.

## 2019-02-28 LAB — NOVEL CORONAVIRUS, NAA (HOSP ORDER, SEND-OUT TO REF LAB; TAT 18-24 HRS): SARS-CoV-2, NAA: NOT DETECTED

## 2019-03-02 ENCOUNTER — Encounter (HOSPITAL_COMMUNITY): Payer: Self-pay | Admitting: Surgery

## 2019-03-02 DIAGNOSIS — K8010 Calculus of gallbladder with chronic cholecystitis without obstruction: Secondary | ICD-10-CM | POA: Diagnosis present

## 2019-03-02 NOTE — H&P (Signed)
General Surgery [facility]  Teresa Knapp DOB: 1956/02/07 Married / Language: English / Race: Black or African American Female   History of Present Illness  The patient is a 63 year old female who presents for evaluation of gall stones.  CHIEF COMPLAINT: symptomatic cholelithiasis  Patient is referred by Dr. Yaakov Guthrie for surgical evaluation and management of symptomatic cholelithiasis. Patient's primary care physician is Dr. Kathyrn Lass. Patient had an episode in July 2020 of epigastric abdominal pain following a large meal including salsa and chips. Pain was in the epigastrium without radiation. She developed nausea and emesis. Pain lasted 4-5 hours and then gradually resolved. Looking back, the patient believes she has had other more mild episodes following certain meals. She denies any history of jaundice or acholic stools. She denies any history of fever or chills. There is no family history of gallbladder disease. Patient has had previous cesarean section. She has had no other abdominal surgery. She presents today for evaluation for symptomatic cholelithiasis. Ultrasounds were performed on August 12 and September 2. Both studies demonstrated cholelithiasis. There was fatty infiltration of the liver. There was no biliary dilatation.   Past Surgical History Cesarean Section - Multiple   Diagnostic Studies History  Colonoscopy  1-5 years ago Mammogram  1-3 years ago Pap Smear  1-5 years ago  Allergies No Known Drug Allergies  [02/19/2019]: Allergies Reconciled   Medication History  HYDROcodone-Acetaminophen (5-325MG  Tablet, Oral) Active. Ondansetron (4MG  Tablet Disint, Oral) Active. Medications Reconciled  Social History Alcohol use  Occasional alcohol use. No caffeine use  No drug use  Tobacco use  Never smoker.  Family History Alcohol Abuse  Brother, Father. Cancer  Mother. Hypertension   Mother.  Pregnancy / Birth History  Age at menarche  57 years. Age of menopause  38-50 Contraceptive History  Oral contraceptives. Gravida  2 Length (months) of breastfeeding  3-6 Maternal age  60-35 Para  2  Other Problems  Cholelithiasis   Review of Systems  General Not Present- Appetite Loss, Chills, Fatigue, Fever, Night Sweats, Weight Gain and Weight Loss. Skin Not Present- Change in Wart/Mole, Dryness, Hives, Jaundice, New Lesions, Non-Healing Wounds, Rash and Ulcer. HEENT Not Present- Earache, Hearing Loss, Hoarseness, Nose Bleed, Oral Ulcers, Ringing in the Ears, Seasonal Allergies, Sinus Pain, Sore Throat, Visual Disturbances, Wears glasses/contact lenses and Yellow Eyes. Respiratory Not Present- Bloody sputum, Chronic Cough, Difficulty Breathing, Snoring and Wheezing. Breast Not Present- Breast Mass, Breast Pain, Nipple Discharge and Skin Changes. Cardiovascular Not Present- Chest Pain, Difficulty Breathing Lying Down, Leg Cramps, Palpitations, Rapid Heart Rate, Shortness of Breath and Swelling of Extremities. Gastrointestinal Not Present- Abdominal Pain, Bloating, Bloody Stool, Change in Bowel Habits, Chronic diarrhea, Constipation, Difficulty Swallowing, Excessive gas, Gets full quickly at meals, Hemorrhoids, Indigestion, Nausea, Rectal Pain and Vomiting. Female Genitourinary Not Present- Frequency, Nocturia, Painful Urination, Pelvic Pain and Urgency. Musculoskeletal Not Present- Back Pain, Joint Pain, Joint Stiffness, Muscle Pain, Muscle Weakness and Swelling of Extremities. Neurological Not Present- Decreased Memory, Fainting, Headaches, Numbness, Seizures, Tingling, Tremor, Trouble walking and Weakness. Psychiatric Not Present- Anxiety, Bipolar, Change in Sleep Pattern, Depression, Fearful and Frequent crying. Endocrine Not Present- Cold Intolerance, Excessive Hunger, Hair Changes, Heat Intolerance, Hot flashes and New Diabetes. Hematology Not Present- Blood  Thinners, Easy Bruising, Excessive bleeding, Gland problems, HIV and Persistent Infections.  Vitals  Weight: 172 lb Height: 66in Body Surface Area: 1.88 m Body Mass Index: 27.76 kg/m  Temp.: 46F (Oral)  Pulse: 89 (Regular)  BP: 112/72(Sitting, Left Arm, Standard)  Physical Exam  See vital signs recorded above  GENERAL APPEARANCE Development: normal Nutritional status: normal Gross deformities: none  SKIN Rash, lesions, ulcers: none Induration, erythema: none Nodules: none palpable  EYES Conjunctiva and lids: normal Pupils: equal and reactive Iris: normal bilaterally  EARS, NOSE, MOUTH, THROAT External ears: no lesion or deformity External nose: no lesion or deformity Hearing: grossly normal Patient is wearing a mask.  NECK Symmetric: yes Trachea: midline Thyroid: no palpable nodules in the thyroid bed  CHEST Respiratory effort: normal Retraction or accessory muscle use: no Breath sounds: normal bilaterally Rales, rhonchi, wheeze: none  CARDIOVASCULAR Auscultation: regular rhythm, normal rate Murmurs: none Pulses: carotid and radial pulse 2+ palpable Lower extremity edema: none Lower extremity varicosities: none  ABDOMEN Distension: none Masses: none palpable Tenderness: none Hepatosplenomegaly: not present Hernia: Umbilical hernia with 1 cm fascial defect, reducible  MUSCULOSKELETAL Station and gait: normal Digits and nails: no clubbing or cyanosis Muscle strength: grossly normal all extremities Range of motion: grossly normal all extremities Deformity: none  LYMPHATIC Cervical: none palpable Supraclavicular: none palpable  PSYCHIATRIC Oriented to person, place, and time: yes Mood and affect: normal for situation Judgment and insight: appropriate for situation    Assessment & Plan  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  Patient is referred by her primary care physician for evaluation of symptomatic cholelithiasis and probable chronic cholecystitis. She is provided with written literature on gallbladder surgery to review at home.  Patient has had at least one significant episode of biliary colic. Ultrasound documents multiple gallstones. I recommended proceeding with laparoscopic cholecystectomy with intraoperative cholangiography. Patient also has a small umbilical hernia which we will repair primarily at the same time as her procedure. We discussed the hospital stay to be anticipated. We discussed postoperative recovery. Patient understands and wishes to proceed with surgery in the near future.  The risks and benefits of the procedure have been discussed at length with the patient. The patient understands the proposed procedure, potential alternative treatments, and the course of recovery to be expected. All of the patient's questions have been answered at this time. The patient wishes to proceed with surgery.   Armandina Gemma, [place] [facility] Office: [phone]

## 2019-03-03 ENCOUNTER — Ambulatory Visit (HOSPITAL_COMMUNITY): Payer: BC Managed Care – PPO | Admitting: Certified Registered"

## 2019-03-03 ENCOUNTER — Ambulatory Visit (HOSPITAL_COMMUNITY): Payer: BC Managed Care – PPO | Admitting: Physician Assistant

## 2019-03-03 ENCOUNTER — Observation Stay (HOSPITAL_COMMUNITY)
Admission: RE | Admit: 2019-03-03 | Discharge: 2019-03-04 | Disposition: A | Discharge status: Home / Self Care | Payer: BC Managed Care – PPO | Attending: Surgery | Admitting: Surgery

## 2019-03-03 ENCOUNTER — Encounter (HOSPITAL_COMMUNITY)
Admission: RE | Disposition: A | Discharge status: Home / Self Care | Payer: Self-pay | Source: Home / Self Care | Attending: Surgery

## 2019-03-03 ENCOUNTER — Ambulatory Visit (HOSPITAL_COMMUNITY): Payer: BC Managed Care – PPO

## 2019-03-03 ENCOUNTER — Encounter (HOSPITAL_COMMUNITY): Payer: Self-pay | Admitting: Certified Registered"

## 2019-03-03 ENCOUNTER — Other Ambulatory Visit: Payer: Self-pay

## 2019-03-03 DIAGNOSIS — K8010 Calculus of gallbladder with chronic cholecystitis without obstruction: Secondary | ICD-10-CM | POA: Diagnosis present

## 2019-03-03 DIAGNOSIS — K660 Peritoneal adhesions (postprocedural) (postinfection): Secondary | ICD-10-CM | POA: Diagnosis not present

## 2019-03-03 DIAGNOSIS — Z79899 Other long term (current) drug therapy: Secondary | ICD-10-CM | POA: Diagnosis not present

## 2019-03-03 DIAGNOSIS — K760 Fatty (change of) liver, not elsewhere classified: Secondary | ICD-10-CM | POA: Insufficient documentation

## 2019-03-03 DIAGNOSIS — K429 Umbilical hernia without obstruction or gangrene: Secondary | ICD-10-CM | POA: Diagnosis not present

## 2019-03-03 DIAGNOSIS — K811 Chronic cholecystitis: Secondary | ICD-10-CM

## 2019-03-03 HISTORY — PX: UMBILICAL HERNIA REPAIR: SHX196

## 2019-03-03 HISTORY — PX: CHOLECYSTECTOMY: SHX55

## 2019-03-03 SURGERY — LAPAROSCOPIC CHOLECYSTECTOMY WITH INTRAOPERATIVE CHOLANGIOGRAM
Anesthesia: General | Site: Abdomen

## 2019-03-03 MED ORDER — LIDOCAINE 2% (20 MG/ML) 5 ML SYRINGE
INTRAMUSCULAR | Status: DC | PRN
  Administered 2019-03-03: 100 mg via INTRAVENOUS

## 2019-03-03 MED ORDER — CHLORHEXIDINE GLUCONATE CLOTH 2 % EX PADS: 6.0000 | MEDICATED_PAD | Freq: Once | CUTANEOUS | Status: DC

## 2019-03-03 MED ORDER — ONDANSETRON HCL 4 MG/2ML IJ SOLN
INTRAMUSCULAR | Status: AC
  Filled 2019-03-03: qty 2

## 2019-03-03 MED ORDER — TRAMADOL HCL 50 MG PO TABS: 50.0000 mg | ORAL_TABLET | Freq: Four times a day (QID) | ORAL | Status: DC | PRN

## 2019-03-03 MED ORDER — ONDANSETRON HCL 4 MG/2ML IJ SOLN
4.0000 mg | Freq: Four times a day (QID) | INTRAMUSCULAR | Status: DC | PRN
  Administered 2019-03-03: 4 mg via INTRAVENOUS
  Filled 2019-03-03: qty 2

## 2019-03-03 MED ORDER — DEXAMETHASONE SODIUM PHOSPHATE 10 MG/ML IJ SOLN
INTRAMUSCULAR | Status: AC
  Filled 2019-03-03: qty 1

## 2019-03-03 MED ORDER — 0.9 % SODIUM CHLORIDE (POUR BTL) OPTIME
TOPICAL | Status: DC | PRN
  Administered 2019-03-03: 1000 mL

## 2019-03-03 MED ORDER — GLYCOPYRROLATE PF 0.2 MG/ML IJ SOSY
PREFILLED_SYRINGE | INTRAMUSCULAR | Status: DC | PRN
  Administered 2019-03-03: .2 mg via INTRAVENOUS

## 2019-03-03 MED ORDER — GLYCOPYRROLATE PF 0.2 MG/ML IJ SOSY
PREFILLED_SYRINGE | INTRAMUSCULAR | Status: AC
  Filled 2019-03-03: qty 1

## 2019-03-03 MED ORDER — LACTATED RINGERS IR SOLN
Status: DC | PRN
  Administered 2019-03-03: 1000 mL

## 2019-03-03 MED ORDER — MIDAZOLAM HCL 5 MG/5ML IJ SOLN
INTRAMUSCULAR | Status: DC | PRN
  Administered 2019-03-03: 2 mg via INTRAVENOUS

## 2019-03-03 MED ORDER — FENTANYL CITRATE (PF) 100 MCG/2ML IJ SOLN
25.0000 ug | INTRAMUSCULAR | Status: DC | PRN
  Administered 2019-03-03 (×2): 50 ug via INTRAVENOUS

## 2019-03-03 MED ORDER — HYDROMORPHONE HCL 1 MG/ML IJ SOLN: 1.0000 mg | INTRAMUSCULAR | Status: DC | PRN

## 2019-03-03 MED ORDER — FENTANYL CITRATE (PF) 100 MCG/2ML IJ SOLN
INTRAMUSCULAR | Status: AC
  Filled 2019-03-03: qty 2

## 2019-03-03 MED ORDER — KCL IN DEXTROSE-NACL 20-5-0.45 MEQ/L-%-% IV SOLN
INTRAVENOUS | Status: DC
  Administered 2019-03-03: 16:00:00 via INTRAVENOUS
  Filled 2019-03-03 (×2): qty 1000

## 2019-03-03 MED ORDER — KETOROLAC TROMETHAMINE 30 MG/ML IJ SOLN: 30.0000 mg | Freq: Once | INTRAMUSCULAR | Status: DC | PRN

## 2019-03-03 MED ORDER — LIDOCAINE 2% (20 MG/ML) 5 ML SYRINGE
INTRAMUSCULAR | Status: AC
  Filled 2019-03-03: qty 5

## 2019-03-03 MED ORDER — PROPOFOL 10 MG/ML IV BOLUS
INTRAVENOUS | Status: DC | PRN
  Administered 2019-03-03: 30 mg via INTRAVENOUS
  Administered 2019-03-03: 110 mg via INTRAVENOUS

## 2019-03-03 MED ORDER — ONDANSETRON HCL 4 MG/2ML IJ SOLN
INTRAMUSCULAR | Status: DC | PRN
  Administered 2019-03-03: 4 mg via INTRAVENOUS

## 2019-03-03 MED ORDER — FENTANYL CITRATE (PF) 250 MCG/5ML IJ SOLN
INTRAMUSCULAR | Status: AC
  Filled 2019-03-03: qty 5

## 2019-03-03 MED ORDER — SUGAMMADEX SODIUM 200 MG/2ML IV SOLN
INTRAVENOUS | Status: DC | PRN
  Administered 2019-03-03: 200 mg via INTRAVENOUS

## 2019-03-03 MED ORDER — BUPIVACAINE HCL (PF) 0.25 % IJ SOLN
INTRAMUSCULAR | Status: DC | PRN
  Administered 2019-03-03: 20 mL

## 2019-03-03 MED ORDER — ROCURONIUM BROMIDE 10 MG/ML (PF) SYRINGE
PREFILLED_SYRINGE | INTRAVENOUS | Status: AC
  Filled 2019-03-03: qty 10

## 2019-03-03 MED ORDER — ROCURONIUM BROMIDE 10 MG/ML (PF) SYRINGE
PREFILLED_SYRINGE | INTRAVENOUS | Status: DC | PRN
  Administered 2019-03-03: 10 mg via INTRAVENOUS
  Administered 2019-03-03: 50 mg via INTRAVENOUS

## 2019-03-03 MED ORDER — PROPOFOL 10 MG/ML IV BOLUS
INTRAVENOUS | Status: AC
  Filled 2019-03-03: qty 20

## 2019-03-03 MED ORDER — ONDANSETRON 4 MG PO TBDP: 4.0000 mg | ORAL_TABLET | Freq: Four times a day (QID) | ORAL | Status: DC | PRN

## 2019-03-03 MED ORDER — BUPIVACAINE HCL (PF) 0.25 % IJ SOLN
INTRAMUSCULAR | Status: AC
  Filled 2019-03-03: qty 30

## 2019-03-03 MED ORDER — LACTATED RINGERS IV SOLN
INTRAVENOUS | Status: DC
  Administered 2019-03-03: 11:00:00 via INTRAVENOUS

## 2019-03-03 MED ORDER — EPHEDRINE 5 MG/ML INJ
INTRAVENOUS | Status: AC
  Filled 2019-03-03: qty 10

## 2019-03-03 MED ORDER — PROMETHAZINE HCL 25 MG/ML IJ SOLN: 6.2500 mg | INTRAMUSCULAR | Status: DC | PRN

## 2019-03-03 MED ORDER — ACETAMINOPHEN 650 MG RE SUPP
650.0000 mg | Freq: Four times a day (QID) | RECTAL | Status: DC | PRN
  Filled 2019-03-03: qty 1

## 2019-03-03 MED ORDER — MIDAZOLAM HCL 2 MG/2ML IJ SOLN
INTRAMUSCULAR | Status: AC
  Filled 2019-03-03: qty 2

## 2019-03-03 MED ORDER — IOHEXOL 300 MG/ML  SOLN
INTRAMUSCULAR | Status: DC | PRN
  Administered 2019-03-03: 16.5 mL

## 2019-03-03 MED ORDER — ACETAMINOPHEN 325 MG PO TABS: 650.0000 mg | ORAL_TABLET | Freq: Four times a day (QID) | ORAL | Status: DC | PRN

## 2019-03-03 MED ORDER — HYDROCODONE-ACETAMINOPHEN 5-325 MG PO TABS: 1.0000 | ORAL_TABLET | ORAL | Status: DC | PRN

## 2019-03-03 MED ORDER — EPHEDRINE SULFATE-NACL 50-0.9 MG/10ML-% IV SOSY
PREFILLED_SYRINGE | INTRAVENOUS | Status: DC | PRN
  Administered 2019-03-03: 15 mg via INTRAVENOUS
  Administered 2019-03-03: 10 mg via INTRAVENOUS

## 2019-03-03 MED ORDER — CEFAZOLIN SODIUM-DEXTROSE 2-4 GM/100ML-% IV SOLN
2.0000 g | INTRAVENOUS | Status: AC
  Administered 2019-03-03: 2 g via INTRAVENOUS
  Filled 2019-03-03: qty 100

## 2019-03-03 MED ORDER — DEXAMETHASONE SODIUM PHOSPHATE 10 MG/ML IJ SOLN
INTRAMUSCULAR | Status: DC | PRN
  Administered 2019-03-03: 5 mg via INTRAVENOUS

## 2019-03-03 MED ORDER — FENTANYL CITRATE (PF) 250 MCG/5ML IJ SOLN
INTRAMUSCULAR | Status: DC | PRN
  Administered 2019-03-03 (×2): 50 ug via INTRAVENOUS
  Administered 2019-03-03: 100 ug via INTRAVENOUS

## 2019-03-03 MED ORDER — ARTIFICIAL TEARS OPHTHALMIC OINT
TOPICAL_OINTMENT | OPHTHALMIC | Status: AC
  Filled 2019-03-03: qty 3.5

## 2019-03-03 SURGICAL SUPPLY — 49 items
ADH SKN CLS APL DERMABOND .7 (GAUZE/BANDAGES/DRESSINGS) ×1
APL PRP STRL LF DISP 70% ISPRP (MISCELLANEOUS) ×2
APPLIER CLIP ROT 10 11.4 M/L (STAPLE) ×2
APR CLP MED LRG 11.4X10 (STAPLE) ×1
BAG SPEC RTRVL LRG 6X4 10 (ENDOMECHANICALS) ×1
BLADE HEX COATED 2.75 (ELECTRODE) IMPLANT
CABLE HIGH FREQUENCY MONO STRZ (ELECTRODE) ×2 IMPLANT
CHLORAPREP W/TINT 26 (MISCELLANEOUS) ×4 IMPLANT
CLIP APPLIE ROT 10 11.4 M/L (STAPLE) ×1 IMPLANT
COVER MAYO STAND STRL (DRAPES) ×2 IMPLANT
COVER SURGICAL LIGHT HANDLE (MISCELLANEOUS) ×2 IMPLANT
COVER WAND RF STERILE (DRAPES) IMPLANT
DECANTER SPIKE VIAL GLASS SM (MISCELLANEOUS) ×2 IMPLANT
DERMABOND ADVANCED (GAUZE/BANDAGES/DRESSINGS) ×1
DERMABOND ADVANCED .7 DNX12 (GAUZE/BANDAGES/DRESSINGS) IMPLANT
DRAPE C-ARM 42X120 X-RAY (DRAPES) ×2 IMPLANT
DRAPE LAPAROSCOPIC ABDOMINAL (DRAPES) ×2 IMPLANT
ELECT REM PT RETURN 15FT ADLT (MISCELLANEOUS) ×2 IMPLANT
GAUZE SPONGE 2X2 8PLY STRL LF (GAUZE/BANDAGES/DRESSINGS) ×1 IMPLANT
GAUZE SPONGE 4X4 12PLY STRL (GAUZE/BANDAGES/DRESSINGS) IMPLANT
GLOVE SURG ORTHO 8.0 STRL STRW (GLOVE) ×2 IMPLANT
GOWN STRL REUS W/TWL XL LVL3 (GOWN DISPOSABLE) ×4 IMPLANT
HEMOSTAT SURGICEL 4X8 (HEMOSTASIS) IMPLANT
KIT BASIN OR (CUSTOM PROCEDURE TRAY) ×2 IMPLANT
KIT TURNOVER KIT A (KITS) IMPLANT
MARKER SKIN DUAL TIP RULER LAB (MISCELLANEOUS) ×2 IMPLANT
NEEDLE HYPO 22GX1.5 SAFETY (NEEDLE) ×2 IMPLANT
PACK GENERAL/GYN (CUSTOM PROCEDURE TRAY) ×2 IMPLANT
POUCH SPECIMEN RETRIEVAL 10MM (ENDOMECHANICALS) ×2 IMPLANT
SCISSORS LAP 5X35 DISP (ENDOMECHANICALS) ×2 IMPLANT
SET CHOLANGIOGRAPH MIX (MISCELLANEOUS) ×2 IMPLANT
SET IRRIG TUBING LAPAROSCOPIC (IRRIGATION / IRRIGATOR) ×2 IMPLANT
SET TUBE SMOKE EVAC HIGH FLOW (TUBING) IMPLANT
SLEEVE XCEL OPT CAN 5 100 (ENDOMECHANICALS) ×2 IMPLANT
SPONGE GAUZE 2X2 STER 10/PKG (GAUZE/BANDAGES/DRESSINGS) ×1
STRIP CLOSURE SKIN 1/2X4 (GAUZE/BANDAGES/DRESSINGS) ×2 IMPLANT
SUT MNCRL AB 4-0 PS2 18 (SUTURE) ×2 IMPLANT
SUT NOVA NAB DX-16 0-1 5-0 T12 (SUTURE) ×2 IMPLANT
SUT NOVA NAB GS-21 0 18 T12 DT (SUTURE) IMPLANT
SUT VIC AB 3-0 SH 18 (SUTURE) ×2 IMPLANT
SUT VIC AB 3-0 SH 27 (SUTURE) ×2
SUT VIC AB 3-0 SH 27XBRD (SUTURE) IMPLANT
SYR 20ML LL LF (SYRINGE) ×2 IMPLANT
TOWEL OR 17X26 10 PK STRL BLUE (TOWEL DISPOSABLE) ×2 IMPLANT
TOWEL OR NON WOVEN STRL DISP B (DISPOSABLE) ×2 IMPLANT
TRAY LAPAROSCOPIC (CUSTOM PROCEDURE TRAY) ×2 IMPLANT
TROCAR BLADELESS OPT 5 100 (ENDOMECHANICALS) ×2 IMPLANT
TROCAR XCEL BLUNT TIP 100MML (ENDOMECHANICALS) ×2 IMPLANT
TROCAR XCEL NON-BLD 11X100MML (ENDOMECHANICALS) ×2 IMPLANT

## 2019-03-03 NOTE — Anesthesia Preprocedure Evaluation (Signed)
Anesthesia Evaluation  Patient identified by MRN, date of birth, ID band Patient awake    Reviewed: Allergy & Precautions, NPO status , Patient's Chart, lab work & pertinent test results  Airway Mallampati: II  TM Distance: >3 FB Neck ROM: Full    Dental no notable dental hx.    Pulmonary neg pulmonary ROS,    Pulmonary exam normal breath sounds clear to auscultation       Cardiovascular negative cardio ROS Normal cardiovascular exam Rhythm:Regular Rate:Normal     Neuro/Psych negative neurological ROS  negative psych ROS   GI/Hepatic negative GI ROS, Neg liver ROS,   Endo/Other  negative endocrine ROS  Renal/GU negative Renal ROS  negative genitourinary   Musculoskeletal negative musculoskeletal ROS (+)   Abdominal   Peds negative pediatric ROS (+)  Hematology negative hematology ROS (+)   Anesthesia Other Findings   Reproductive/Obstetrics negative OB ROS                             Anesthesia Physical Anesthesia Plan  ASA: I  Anesthesia Plan: General   Post-op Pain Management:    Induction: Intravenous  PONV Risk Score and Plan: 3 and Ondansetron, Dexamethasone and Treatment may vary due to age or medical condition  Airway Management Planned: Oral ETT  Additional Equipment:   Intra-op Plan:   Post-operative Plan: Extubation in OR  Informed Consent: I have reviewed the patients History and Physical, chart, labs and discussed the procedure including the risks, benefits and alternatives for the proposed anesthesia with the patient or authorized representative who has indicated his/her understanding and acceptance.     Dental advisory given  Plan Discussed with: CRNA and Surgeon  Anesthesia Plan Comments:         Anesthesia Quick Evaluation

## 2019-03-03 NOTE — Transfer of Care (Signed)
Immediate Anesthesia Transfer of Care Note  Patient: Teresa Knapp  Procedure(s) Performed: LAPAROSCOPIC CHOLECYSTECTOMY WITH INTRAOPERATIVE CHOLANGIOGRAM (N/A Abdomen) PRIMARY REPAIR OF UMBILICAL HERNIA (N/A Abdomen)  Patient Location: PACU  Anesthesia Type:General  Level of Consciousness: awake, alert , oriented and patient cooperative  Airway & Oxygen Therapy: Patient Spontanous Breathing and Patient connected to face mask oxygen  Post-op Assessment: Report given to RN, Post -op Vital signs reviewed and stable and Patient moving all extremities  Post vital signs: Reviewed and stable  Last Vitals:  Vitals Value Taken Time  BP    Temp    Pulse    Resp    SpO2      Last Pain:  Vitals:   03/03/19 1046  TempSrc:   PainSc: 0-No pain         Complications: No apparent anesthesia complications

## 2019-03-03 NOTE — Anesthesia Procedure Notes (Signed)
Procedure Name: Intubation Date/Time: 03/03/2019 12:28 PM Performed by: Myna Bright, CRNA Pre-anesthesia Checklist: Patient identified, Emergency Drugs available, Suction available and Patient being monitored Patient Re-evaluated:Patient Re-evaluated prior to induction Oxygen Delivery Method: Circle system utilized Preoxygenation: Pre-oxygenation with 100% oxygen Induction Type: IV induction Ventilation: Mask ventilation without difficulty Laryngoscope Size: Mac and 3 Grade View: Grade I Tube type: Oral Tube size: 7.0 mm Number of attempts: 1 Airway Equipment and Method: Stylet Placement Confirmation: ETT inserted through vocal cords under direct vision,  positive ETCO2 and breath sounds checked- equal and bilateral Secured at: 21 cm Tube secured with: Tape Dental Injury: Teeth and Oropharynx as per pre-operative assessment

## 2019-03-03 NOTE — Anesthesia Postprocedure Evaluation (Signed)
Anesthesia Post Note  Patient: Ethne E Motley-Mims  Procedure(s) Performed: LAPAROSCOPIC CHOLECYSTECTOMY WITH INTRAOPERATIVE CHOLANGIOGRAM (N/A Abdomen) PRIMARY REPAIR OF UMBILICAL HERNIA (N/A Abdomen)     Patient location during evaluation: PACU Anesthesia Type: General Level of consciousness: awake and alert Pain management: pain level controlled Vital Signs Assessment: post-procedure vital signs reviewed and stable Respiratory status: spontaneous breathing, nonlabored ventilation, respiratory function stable and patient connected to nasal cannula oxygen Cardiovascular status: blood pressure returned to baseline and stable Postop Assessment: no apparent nausea or vomiting Anesthetic complications: no    Last Vitals:  Vitals:   03/03/19 1515 03/03/19 1531  BP: 113/78 126/81  Pulse: 66 79  Resp: 18 12  Temp:  36.4 C  SpO2: 94% 95%    Last Pain:  Vitals:   03/03/19 1531  TempSrc: Oral  PainSc:                  Cressie Betzler S

## 2019-03-03 NOTE — Op Note (Signed)
Procedure Note  Pre-operative Diagnosis:  Chronic cholecystitis, cholelithiasis  Post-operative Diagnosis:  same  Surgeon:  Armandina Gemma, MD  Assistant:  none   Procedure:  Laparoscopic cholecystectomy with intra-operative cholangiography, primary repair of umbilical hernia  Anesthesia:  General  Estimated Blood Loss:  minimal  Drains: none         Specimen: gallbladder to pathology  Indications:  Patient is referred by Dr. Yaakov Guthrie for surgical evaluation and management of symptomatic cholelithiasis. Patient's primary care physician is Dr. Kathyrn Lass. Patient had an episode in July 2020 of epigastric abdominal pain following a large meal including salsa and chips. Pain was in the epigastrium without radiation. She developed nausea and emesis. Pain lasted 4-5 hours and then gradually resolved. Looking back, the patient believes she has had other more mild episodes following certain meals. She denies any history of jaundice or acholic stools. She denies any history of fever or chills. There is no family history of gallbladder disease. Patient has had previous cesarean section. She has had no other abdominal surgery. She presents today for evaluation for symptomatic cholelithiasis. Ultrasounds were performed on August 12 and September 2. Both studies demonstrated cholelithiasis. There was fatty infiltration of the liver. There was no biliary dilatation.  Procedure Details:  The patient was seen in the pre-op holding area. The risks, benefits, complications, treatment options, and expected outcomes were previously discussed with the patient. The patient agreed with the proposed plan and has signed the informed consent form.  The patient was transported to operating room # 2 at the [facility]. The patient was placed in the supine position on the operating room table. Following induction of general anesthesia, the abdomen was prepped and draped in the usual aseptic  fashion.  An incision was made in the skin near the umbilicus. The skin of the umbilicus was elevated and the fascial defect defined. The peritoneal cavity was entered and a Hasson cannula was introduced under direct vision. The cannula was secured with a 0-Vicryl pursestring suture. Pneumoperitoneum was established with carbon dioxide. Additional cannulae were introduced under direct vision along the right costal margin in the midline, mid-clavicular line, and anterior axillary line.   The gallbladder was identified and the fundus grasped and retracted cephalad. Adhesions were taken down bluntly and the electrocautery was utilized as needed, taking care not to involve any adjacent structures. The infundibulum was grasped and retracted laterally, exposing the peritoneum overlying the triangle of Calot. The peritoneum was incised and structures exposed with blunt dissection. The cystic duct was clearly identified, bluntly dissected circumferentially, and clipped at the neck of the gallbladder.  An incision was made in the cystic duct and the cholangiogram catheter introduced. The catheter was secured using an ligaclip.  Real-time cholangiography was performed using C-arm fluoroscopy.  There was rapid filling of a normal caliber common bile duct.  There was reflux of contrast into the left and right hepatic ductal systems.  There was free flow distally into the duodenum without filling defect or obstruction.  The catheter was removed from the peritoneal cavity.  The cystic duct was then ligated with ligaclips and divided. The cystic artery was identified, dissected circumferentially, ligated with ligaclips, and divided.  The gallbladder was dissected away from the gallbladder bed using the electrocautery for hemostasis. The gallbladder was completely removed from the liver and placed into an endocatch bag. The gallbladder was removed in the endocatch bag through the umbilical port site and submitted to  pathology for review.  The right upper quadrant was irrigated and the gallbladder bed was inspected. Hemostasis was achieved with the electrocautery.  Cannulae were removed under direct vision and good hemostasis was noted. Pneumoperitoneum was released and the majority of the carbon dioxide evacuated. The umbilical wound was irrigated and the fascia was then closed with the interrupted #1 Novofil sutures.  The umbilical skin was affixed to the fascia with an interrupted #1 Novofil suture.  Subcutaneous tissues were closed with interrupted 3-0 Vicryl sutures.  Local anesthetic was infiltrated at all port sites. Skin incisions were closed with 4-0 Monocril subcuticular sutures and Dermabond was applied.  Instrument, sponge, and needle counts were correct at the conclusion of the case.  The patient was awakened from anesthesia and brought to the recovery room in stable condition.  The patient tolerated the procedure well.   Armandina Gemma, MD [facility] Office: [phone]

## 2019-03-03 NOTE — Interval H&P Note (Signed)
History and Physical Interval Note:  03/03/2019 12:13 PM  Teresa Knapp  has presented today for surgery, with the diagnosis of chronic cholecystitis,cholelithiasis,umbilical hernia.  The various methods of treatment have been discussed with the patient and family. After consideration of risks, benefits and other options for treatment, the patient has consented to    Procedure(s): LAPAROSCOPIC CHOLECYSTECTOMY WITH INTRAOPERATIVE CHOLANGIOGRAM (N/A) PRIMARY REPAIR OF UMBILICAL HERNIA (N/A) as a surgical intervention.    The patient's history has been reviewed, patient examined, no change in status, stable for surgery.  I have reviewed the patient's chart and labs.  Questions were answered to the patient's satisfaction.    Armandina Gemma, [place] [facility] Office: [place]

## 2019-03-04 ENCOUNTER — Ambulatory Visit: Payer: BC Managed Care – PPO | Admitting: Dietician

## 2019-03-04 ENCOUNTER — Encounter (HOSPITAL_COMMUNITY): Payer: Self-pay | Admitting: Surgery

## 2019-03-04 DIAGNOSIS — K8010 Calculus of gallbladder with chronic cholecystitis without obstruction: Secondary | ICD-10-CM | POA: Diagnosis not present

## 2019-03-04 LAB — SURGICAL PATHOLOGY

## 2019-03-04 MED ORDER — TRAMADOL HCL 50 MG PO TABS: 50.0000 mg | ORAL_TABLET | Freq: Four times a day (QID) | ORAL | 0 refills | Status: DC | PRN

## 2019-03-04 NOTE — Progress Notes (Signed)
Pt alert and oriented, tolerating diet. D/C instructions given, all questions answered. Pt d/cd home.

## 2019-03-04 NOTE — Discharge Summary (Signed)
Physician Discharge Summary [facility]  Patient ID: Teresa Knapp MRN: [id number] DOB/AGE: 1956-01-01 63 y.o.  Admit date: 03/03/2019 Discharge date: 03/04/2019  Admission Diagnoses:  Chronic cholecystitis, cholelithiasis, umbilical hernia  Discharge Diagnoses:  Principal Problem:   Cholelithiasis with chronic cholecystitis   Discharged Condition: good  Hospital Course: Patient was admitted for observation following gallbladder and hernia surgery.  Post op course was uncomplicated.  Pain was well controlled.  Tolerated diet.  Patient was prepared for discharge home on POD#1.  Consults: None  Treatments: surgery: lap chole with IOC, umbilical hernia repair  Discharge Exam: Blood pressure 115/71, pulse 74, temperature 98.2 F (36.8 C), temperature source Oral, resp. rate 18, height 5\' 6"  (1.676 m), weight 77.1 kg, SpO2 93 %. HEENT - clear Neck - soft Chest - clear bilaterally Cor - RRR Abd - soft without distension; wounds dry and intact  Disposition: Home  Discharge Instructions    Diet - low sodium heart healthy   Complete by: As directed    Discharge instructions   Complete by: As directed    [facility]  LAPAROSCOPIC SURGERY:  POST-OP INSTRUCTIONS  Always review your discharge instruction sheet given to you by the facility where your surgery was performed.  A prescription for pain medication may be given to you upon discharge.  Take your pain medication as prescribed.  If narcotic pain medicine is not needed, then you may take acetaminophen (Tylenol) or ibuprofen (Advil) as needed.  Take your usually prescribed medications unless otherwise directed.  If you need a refill on your pain medication, please contact your pharmacy.  They will contact our office to request authorization. Prescriptions will not be filled after 5 P.M. or on weekends.  You should follow a light diet the first few days after arrival home, such  as soup and crackers or toast.  Be sure to include plenty of fluids daily.  Most patients will experience some swelling and bruising in the area of the incisions.  Ice packs will help.  Swelling and bruising can take several days to resolve.   It is common to experience some constipation after surgery.  Increasing fluid intake and taking a stool softener (such as Colace) will usually help or prevent this problem from occurring.  A mild laxative (Milk of Magnesia or Miralax) should be taken according to package instructions if there has been no bowel movement after 48 hours.  You will likely have Dermabond (topical glue) over your incisions.  This seals the incisions and allows you to bathe and shower at any time after your surgery.  Glue should remain in place for up to 10 days.  It may be removed after 10 days by pealing off the Dermabond material or using Vaseline or naval jelly to remove.  If you have steri-strips over your incisions, you may remove the gauze bandage on the second day after surgery, and you may shower at that time.  Leave your steri-strips (small skin tapes) in place directly over the incision.  These strips should remain on the skin for 5-7 days and then be removed.  You may get them wet in the shower and pat them dry.  Any sutures or staples will be removed at the office during your follow-up visit.  ACTIVITIES:  You may resume regular (light) daily activities beginning the next day - such as daily self-care, walking, climbing stairs - gradually increasing activities as tolerated.  You may have sexual intercourse when  it is comfortable.  Refrain from any heavy lifting or straining until approved by your doctor.  You may drive when you are no longer taking prescription pain medication, when you can comfortably wear a seatbelt, and when you can safely maneuver your car and apply brakes.  You should see your doctor in the office for a follow-up appointment approximately 2-3 weeks  after your surgery.  Make sure that you call for this appointment within a day or two after you arrive home to insure a convenient appointment time.  WHEN TO CALL YOUR DOCTOR: Fever over 101.0 Inability to urinate Continued bleeding from incision Increased pain, redness, or drainage from the incision Increasing abdominal pain  The clinic staff is available to answer your questions during regular business hours.  Please don't hesitate to call and ask to speak to one of the nurses for clinical concerns.  If you have a medical emergency, go to the nearest emergency room or call 911.  A surgeon from [facility] is always on call for the hospital.  Earnstine Regal, MD, [facility] Office: [place] Free:  [place] [phone]  Website: [email or web address]   Increase activity slowly   Complete by: As directed    No dressing needed   Complete by: As directed      Allergies as of 03/04/2019   No Known Allergies     Medication List    TAKE these medications   famotidine 20 MG tablet Commonly known as: PEPCID Take 20 mg by mouth daily as needed for heartburn or indigestion.   HYDROcodone-acetaminophen 5-325 MG tablet Commonly known as: NORCO/VICODIN Take 1-2 tablets by mouth every 4 (four) hours as needed. What changed: reasons to take this   ondansetron 4 MG disintegrating tablet Commonly known as: Zofran ODT Take 1 tablet (4 mg total) by mouth every 8 (eight) hours as needed for nausea or vomiting.   traMADol 50 MG tablet Commonly known as: ULTRAM Take 1-2 tablets (50-100 mg total) by mouth every 6 (six) hours as needed.        Earnstine Regal, MD, [facility] Office: [phone]   Signed: Armandina Gemma 03/04/2019, 11:37 AM

## 2019-03-04 NOTE — Plan of Care (Signed)
All goals met for d/c 

## 2019-04-03 ENCOUNTER — Encounter: Payer: Self-pay | Admitting: Dietician

## 2019-04-03 ENCOUNTER — Other Ambulatory Visit: Payer: Self-pay

## 2019-04-03 ENCOUNTER — Encounter: Payer: BC Managed Care – PPO | Attending: Family Medicine | Admitting: Dietician

## 2019-04-03 DIAGNOSIS — K760 Fatty (change of) liver, not elsewhere classified: Secondary | ICD-10-CM | POA: Diagnosis not present

## 2019-04-03 NOTE — Progress Notes (Signed)
  Medical Nutrition Therapy:  Appt start time: 1400 end time:  [id number].   Assessment:  Primary concerns today: Patient is here today alone.  She states that she would like to lose weight.  She states that she eats a lot of vegetables and doesn't like a lot of carbs.  She started walking 2-4 miles per day in March and has stopped eating after 9 pm.  She does not eat 3 meals per day and just eats when she is hungry.  Despite these changes, she has been unable to lose weight.  She was doing yoga classes prior to covid.  She was referred for fatty liver.  Weight: 172 lbs today 172 lbs since age 2 Below 160 lbs before age 62. She lives with her husband.  She shops and they share cooking. She is retired Licensed conveyancer and now a Oceanographer.  She just moved in September and it is not as easy to walk there.     Preferred Learning Style:   No preference indicated   Learning Readiness:   Ready  MEDICATIONS: see list   DIETARY INTAKE: Limits breads, potatoes 24-hr recall:  B ( AM): skips at times "today a sliver of her birthday cake"  Snk ( AM):   L ( PM): spaghetti, red sauce with ground [place], vegetables Snk ( PM): potato chips "bored/habit" D ( PM): spaghetti, red sauce with ground [place], vegetables Snk ( PM): occasional ice cream Beverages: water, coffee with vanilla creamer, occasional OJ, occasional regular soda  Usual physical activity: stopped since moving in September  Progress Towards Goal(s):  In progress.   Nutritional Diagnosis:  NB-1.1 Food and nutrition-related knowledge deficit As related to balance of nutrition.  As evidenced by diet hx and patient report.    Intervention:  Nutrition counseling/education provided.  Discussed sources of fat, portion sizes of foods, balance of carbohydrates, protein, and fat along with mindfulness and calorie density.  PLAN: Find ways to add activity most days of the week.  Walking is a good choice.  Consider options near you such  as the [facility].  Mindfulness:  Choices  Portion sizes of creamer and salad dressing  Eat slowly and stop when you are satisfied.  When snacking ask, "Am I eating because I am hungry or for another reason?"  "What can I do instead?"  Eat plenty of non starchy vegetables Read the label for portion sizes.  Teaching Method Utilized:  Visual Auditory Hands on  Handouts given during visit include:  Sources of fat  Snack list  Meal plan card  Barriers to learning/adherence to lifestyle change: none  Demonstrated degree of understanding via:  Teach Back   Monitoring/Evaluation:  Dietary intake, exercise, label reading, and body weight prn.

## 2019-04-03 NOTE — Patient Instructions (Addendum)
Find ways to add activity most days of the week.  Walking is a good choice.  Consider options near you such as the [facility].  Mindfulness:  Choices  Portion sizes of creamer and salad dressing  Eat slowly and stop when you are satisfied.  When snacking ask, "Am I eating because I am hungry or for another reason?"  "What can I do instead?"  Eat plenty of non starchy vegetables Read the label for portion sizes.

## 2019-04-17 ENCOUNTER — Telehealth (HOSPITAL_COMMUNITY): Payer: Self-pay

## 2019-04-17 NOTE — Telephone Encounter (Signed)
See note

## 2019-04-17 NOTE — Telephone Encounter (Signed)
Patient came through drive through [facility] covid drive thru line, stating she was having surgery 04/21/19 with Dr. Harlow Asa. After swabbing patient, it was discovered that patient is not scheduled for surgery, and had no upcomming appointments with any providers. Attempted to call patient to obtain more information, and inform her that if she is not scheduled for a procedure at a [facility] in the next 4 days we will not be able to run her covid test. No answer, above information and call back number left on patients voicemail.

## 2019-09-22 ENCOUNTER — Encounter: Payer: BC Managed Care – PPO | Admitting: Women's Health

## 2019-10-01 ENCOUNTER — Encounter: Payer: Self-pay | Admitting: Nurse Practitioner

## 2019-10-01 ENCOUNTER — Other Ambulatory Visit: Payer: Self-pay

## 2019-10-01 ENCOUNTER — Ambulatory Visit: Payer: BC Managed Care – PPO | Admitting: Nurse Practitioner

## 2019-10-01 VITALS — BP 124/80 | Ht 65.0 in | Wt 175.0 lb

## 2019-10-01 DIAGNOSIS — Z01419 Encounter for gynecological examination (general) (routine) without abnormal findings: Secondary | ICD-10-CM | POA: Diagnosis not present

## 2019-10-01 DIAGNOSIS — Z780 Asymptomatic menopausal state: Secondary | ICD-10-CM | POA: Diagnosis not present

## 2019-10-01 DIAGNOSIS — Z6829 Body mass index (BMI) 29.0-29.9, adult: Secondary | ICD-10-CM | POA: Diagnosis not present

## 2019-10-01 NOTE — Progress Notes (Signed)
   Teresa Knapp [birth year] [id number]   History:  64 y.o. G2 P2 presents for annual exam without GYN complaints.  Normal mammogram and Pap history.  Osteopenia-taking calcium and vitamin D daily.  Postmenopausal-no HRT with no bleeding. Labs with PCP.  Sexually active with husband.  Has been working hard with low-carb diet and exercise to lose weight with minimal change.  Walks at least 3 miles a day.  Retired Licensed conveyancer, occasionally substitute teaches.  Father died from massive heart attack, mother died from lung cancer, maternal grandmother is a breast cancer survivor.  Gynecologic History No LMP recorded. Patient is postmenopausal.   Last Pap: 09/20/18. Results were: normal Last mammogram: 12/12/18. Results were: normal DEXA: 12/12/18. Results: Osteopenia Colonoscopy 2018. Results: normal  Past medical history, past surgical history, family history and social history were all reviewed and documented in the EPIC chart.  ROS:  A ROS was performed and pertinent positives and negatives are included.  Exam:  Vitals:   10/01/19 1521  Weight: 175 lb (79.4 kg)  Height: 5\' 5"  (1.651 m)   Body mass index is 29.12 kg/m.  General appearance:  Normal Thyroid:  Symmetrical, normal in size, without palpable masses or nodularity. Respiratory  Auscultation:  Clear without wheezing or rhonchi Cardiovascular  Auscultation:  Regular rate, without rubs, murmurs or gallops  Edema/varicosities:  Not grossly evident Abdominal  Soft,nontender, without masses, guarding or rebound.  Liver/spleen:  No organomegaly noted  Hernia:  None appreciated  Skin  Inspection:  Grossly normal   Breasts: Examined lying and sitting.   Right: Without masses, retractions, discharge or axillary adenopathy.   Left: Without masses, retractions, discharge or axillary adenopathy. Gentitourinary   Inguinal/mons:  Normal without inguinal adenopathy  External genitalia:  Normal  BUS/Urethra/Skene's glands:   Normal  Vagina:  Normal, atrophic changes  Cervix:  Normal  Uterus:  Normal in size, shape and contour.  Midline and mobile  Adnexa/parametria:     Rt: Without masses or tenderness.   Lt: Without masses or tenderness.  Anus and perineum: Normal  Digital rectal exam: Normal sphincter tone without palpated masses or tenderness  Assessment/Plan:  64 y.o.  for annual exam.   Well female exam with routine gynecological exam Education provided on SBEs, importance of preventative screenings, current guidelines, low carb/low calorie diet, regular exercise, and daily vitamin D and calcium supplement. Labs done by PCP.  Inform patient that based on her age and normal Pap history she can opt out of these.  She would like to continue.  BMI 29.0-29.9,adult: discussed weight loss management. Will send referral to bariatric clinic  Postmenopausal-no HRT, minimal symptoms  Follow up in 1 year for annual       [place], 3:22 PM 10/01/2019

## 2019-10-01 NOTE — Patient Instructions (Signed)
Health Maintenance, Female Adopting a healthy lifestyle and getting preventive care are important in promoting health and wellness. Ask your health care provider about:  The right schedule for you to have regular tests and exams.  Things you can do on your own to prevent diseases and keep yourself healthy. What should I know about diet, weight, and exercise? Eat a healthy diet   Eat a diet that includes plenty of vegetables, fruits, low-fat dairy products, and lean protein.  Do not eat a lot of foods that are high in solid fats, added sugars, or sodium. Maintain a healthy weight Body mass index (BMI) is used to identify weight problems. It estimates body fat based on height and weight. Your health care provider can help determine your BMI and help you achieve or maintain a healthy weight. Get regular exercise Get regular exercise. This is one of the most important things you can do for your health. Most adults should:  Exercise for at least 150 minutes each week. The exercise should increase your heart rate and make you sweat (moderate-intensity exercise).  Do strengthening exercises at least twice a week. This is in addition to the moderate-intensity exercise.  Spend less time sitting. Even light physical activity can be beneficial. Watch cholesterol and blood lipids Have your blood tested for lipids and cholesterol at 65 years of age, then have this test every 5 years. Have your cholesterol levels checked more often if:  Your lipid or cholesterol levels are high.  You are older than 65 years of age.  You are at high risk for heart disease. What should I know about cancer screening? Depending on your health history and family history, you may need to have cancer screening at various ages. This may include screening for:  Breast cancer.  Cervical cancer.  Colorectal cancer.  Skin cancer.  Lung cancer. What should I know about heart disease, diabetes, and high blood  pressure? Blood pressure and heart disease  High blood pressure causes heart disease and increases the risk of stroke. This is more likely to develop in people who have high blood pressure readings, are of African descent, or are overweight.  Have your blood pressure checked: ? Every 3-5 years if you are 18-39 years of age. ? Every year if you are 40 years old or older. Diabetes Have regular diabetes screenings. This checks your fasting blood sugar level. Have the screening done:  Once every three years after age 40 if you are at a normal weight and have a low risk for diabetes.  More often and at a younger age if you are overweight or have a high risk for diabetes. What should I know about preventing infection? Hepatitis B If you have a higher risk for hepatitis B, you should be screened for this virus. Talk with your health care provider to find out if you are at risk for hepatitis B infection. Hepatitis C Testing is recommended for:  Everyone born from 1945 through 1965.  Anyone with known risk factors for hepatitis C. Sexually transmitted infections (STIs)  Get screened for STIs, including gonorrhea and chlamydia, if: ? You are sexually active and are younger than 65 years of age. ? You are older than 65 years of age and your health care provider tells you that you are at risk for this type of infection. ? Your sexual activity has changed since you were last screened, and you are at increased risk for chlamydia or gonorrhea. Ask your health care provider if   you are at risk.  Ask your health care provider about whether you are at high risk for HIV. Your health care provider may recommend a prescription medicine to help prevent HIV infection. If you choose to take medicine to prevent HIV, you should first get tested for HIV. You should then be tested every 3 months for as long as you are taking the medicine. Pregnancy  If you are about to stop having your period (premenopausal) and  you may become pregnant, seek counseling before you get pregnant.  Take 400 to 800 micrograms (mcg) of folic acid every day if you become pregnant.  Ask for birth control (contraception) if you want to prevent pregnancy. Osteoporosis and menopause Osteoporosis is a disease in which the bones lose minerals and strength with aging. This can result in bone fractures. If you are 65 years old or older, or if you are at risk for osteoporosis and fractures, ask your health care provider if you should:  Be screened for bone loss.  Take a calcium or vitamin D supplement to lower your risk of fractures.  Be given hormone replacement therapy (HRT) to treat symptoms of menopause. Follow these instructions at home: Lifestyle  Do not use any products that contain nicotine or tobacco, such as cigarettes, e-cigarettes, and chewing tobacco. If you need help quitting, ask your health care provider.  Do not use street drugs.  Do not share needles.  Ask your health care provider for help if you need support or information about quitting drugs. Alcohol use  Do not drink alcohol if: ? Your health care provider tells you not to drink. ? You are pregnant, may be pregnant, or are planning to become pregnant.  If you drink alcohol: ? Limit how much you use to 0-1 drink a day. ? Limit intake if you are breastfeeding.  Be aware of how much alcohol is in your drink. In the [place] one drink equals one 12 oz bottle of beer (355 mL), one 5 oz glass of wine (148 mL), or one 1 oz glass of hard liquor (44 mL). General instructions  Schedule regular health, dental, and eye exams.  Stay current with your vaccines.  Tell your health care provider if: ? You often feel depressed. ? You have ever been abused or do not feel safe at home. Summary  Adopting a healthy lifestyle and getting preventive care are important in promoting health and wellness.  Follow your health care provider's instructions about healthy  diet, exercising, and getting tested or screened for diseases.  Follow your health care provider's instructions on monitoring your cholesterol and blood pressure. This information is not intended to replace advice given to you by your health care provider. Make sure you discuss any questions you have with your health care provider. Document Revised: 05/08/2018 Document Reviewed: 05/08/2018 Elsevier Patient Education  2020 Elsevier Inc.  

## 2019-11-10 ENCOUNTER — Other Ambulatory Visit: Payer: Self-pay | Admitting: Family Medicine

## 2019-11-10 DIAGNOSIS — Z1231 Encounter for screening mammogram for malignant neoplasm of breast: Secondary | ICD-10-CM

## 2019-12-19 ENCOUNTER — Other Ambulatory Visit: Payer: Self-pay

## 2019-12-19 ENCOUNTER — Ambulatory Visit
Admission: RE | Admit: 2019-12-19 | Discharge: 2019-12-19 | Disposition: A | Discharge status: Home / Self Care | Payer: BC Managed Care – PPO | Source: Ambulatory Visit | Attending: Family Medicine | Admitting: Family Medicine

## 2019-12-19 DIAGNOSIS — Z1231 Encounter for screening mammogram for malignant neoplasm of breast: Secondary | ICD-10-CM

## 2020-08-04 DIAGNOSIS — E663 Overweight: Secondary | ICD-10-CM | POA: Diagnosis not present

## 2020-08-04 DIAGNOSIS — L918 Other hypertrophic disorders of the skin: Secondary | ICD-10-CM | POA: Diagnosis not present

## 2020-08-04 DIAGNOSIS — L659 Nonscarring hair loss, unspecified: Secondary | ICD-10-CM | POA: Diagnosis not present

## 2020-08-04 DIAGNOSIS — Z1159 Encounter for screening for other viral diseases: Secondary | ICD-10-CM | POA: Diagnosis not present

## 2020-08-04 DIAGNOSIS — Z0000 Encounter for general adult medical examination without abnormal findings: Secondary | ICD-10-CM | POA: Diagnosis not present

## 2020-08-04 DIAGNOSIS — E559 Vitamin D deficiency, unspecified: Secondary | ICD-10-CM | POA: Diagnosis not present

## 2020-08-04 DIAGNOSIS — M8580 Other specified disorders of bone density and structure, unspecified site: Secondary | ICD-10-CM | POA: Diagnosis not present

## 2020-08-04 DIAGNOSIS — D3001 Benign neoplasm of right kidney: Secondary | ICD-10-CM | POA: Diagnosis not present

## 2020-08-04 DIAGNOSIS — Z0100 Encounter for examination of eyes and vision without abnormal findings: Secondary | ICD-10-CM | POA: Diagnosis not present

## 2020-08-04 DIAGNOSIS — R7303 Prediabetes: Secondary | ICD-10-CM | POA: Diagnosis not present

## 2020-08-04 DIAGNOSIS — Z6828 Body mass index (BMI) 28.0-28.9, adult: Secondary | ICD-10-CM | POA: Diagnosis not present

## 2020-08-04 DIAGNOSIS — Z136 Encounter for screening for cardiovascular disorders: Secondary | ICD-10-CM | POA: Diagnosis not present

## 2020-08-04 IMAGING — US US ABDOMEN LIMITED
1 series · 14 of 25 positions shown · non-contrast
Comparison: None.

CLINICAL DATA: Right upper quadrant pain

EXAM:
ULTRASOUND ABDOMEN LIMITED RIGHT UPPER QUADRANT

[Series 1: us abdomen limited · 14 of 28 slices shown]
[im 1/28]
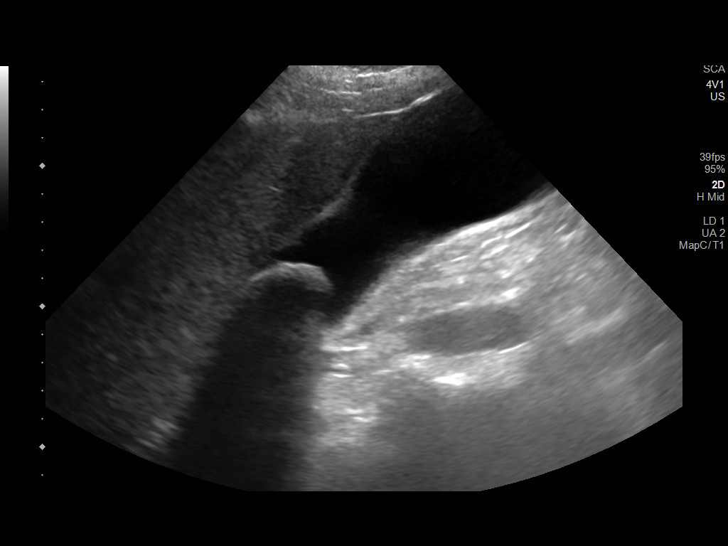
[im 3/28]
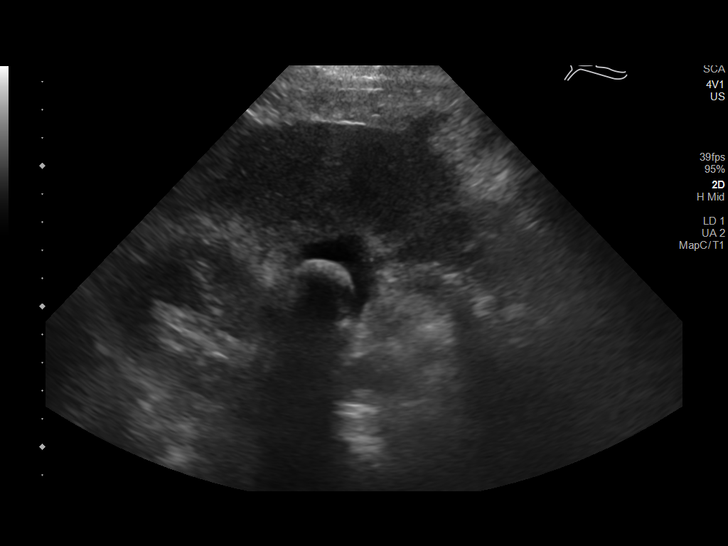
[im 5/28]
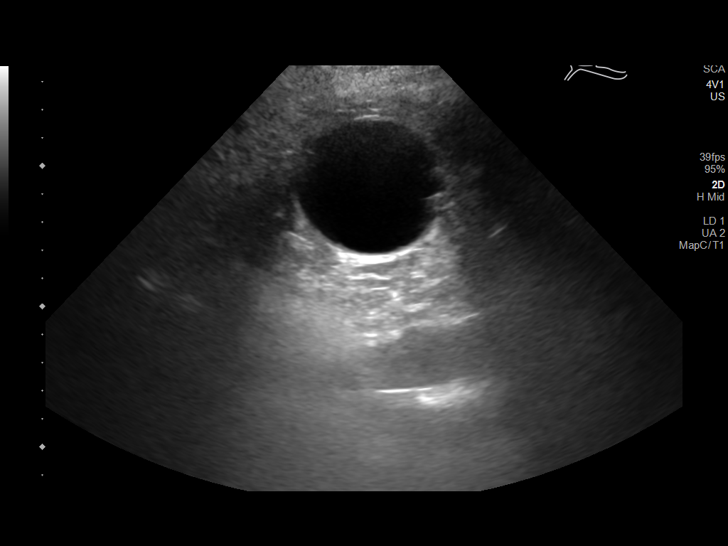
[im 7/28]
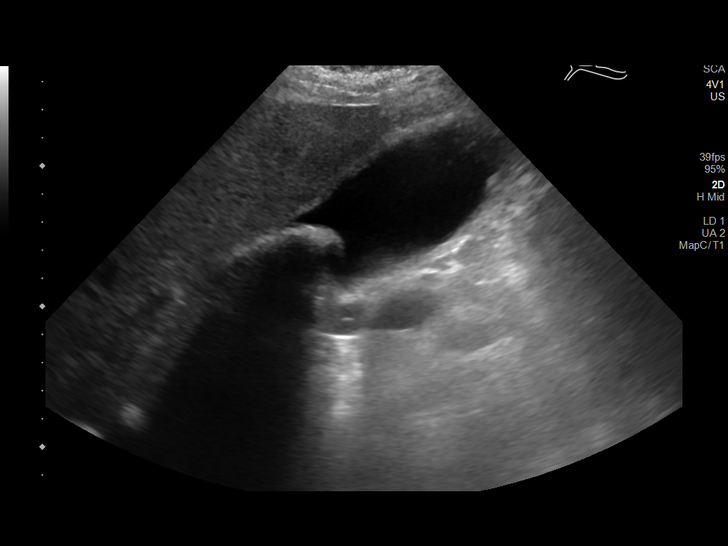
[im 10/28]
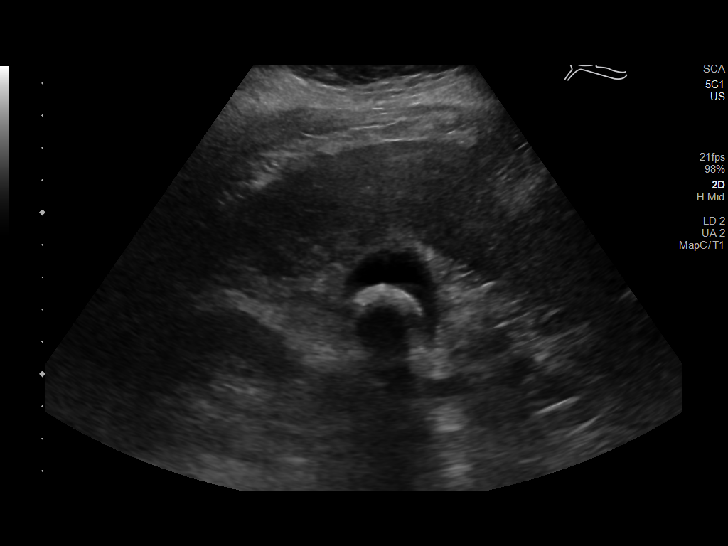
[im 11/28]
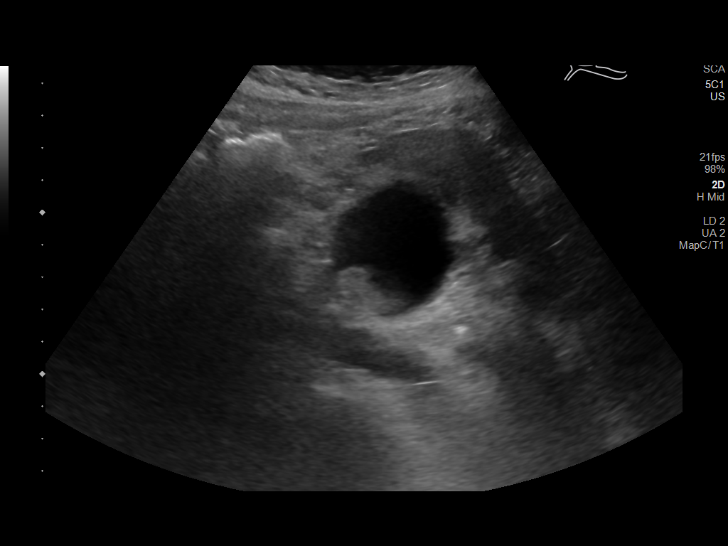
[im 13/28]
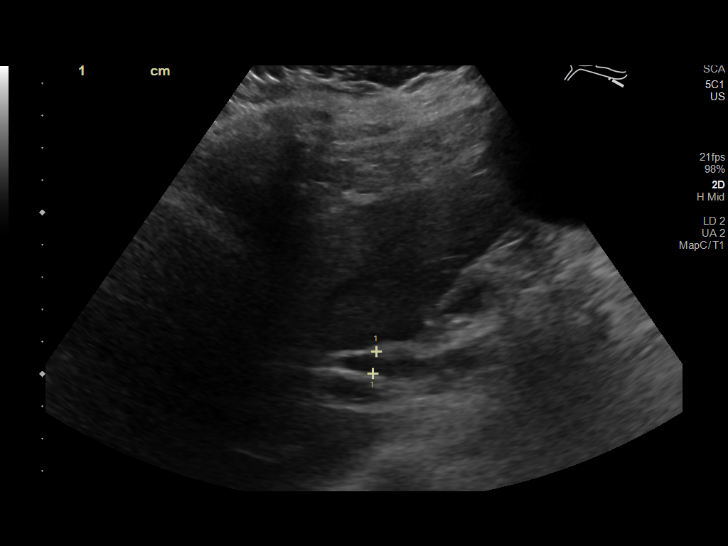
[im 15/28]
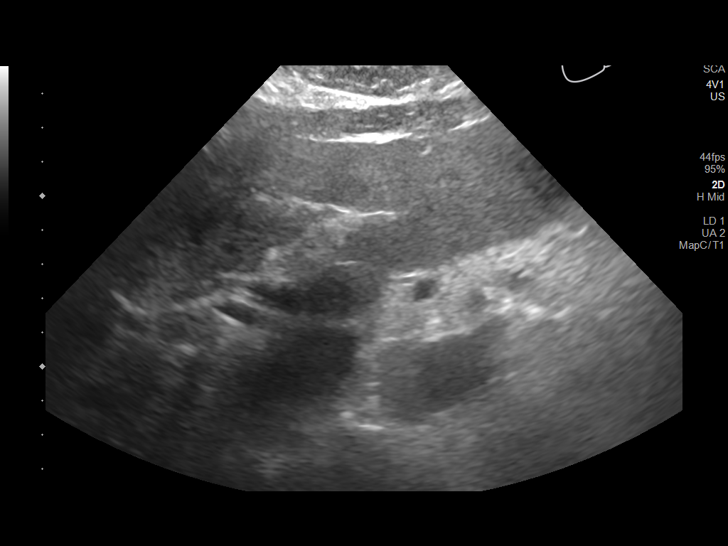
[im 17/28]
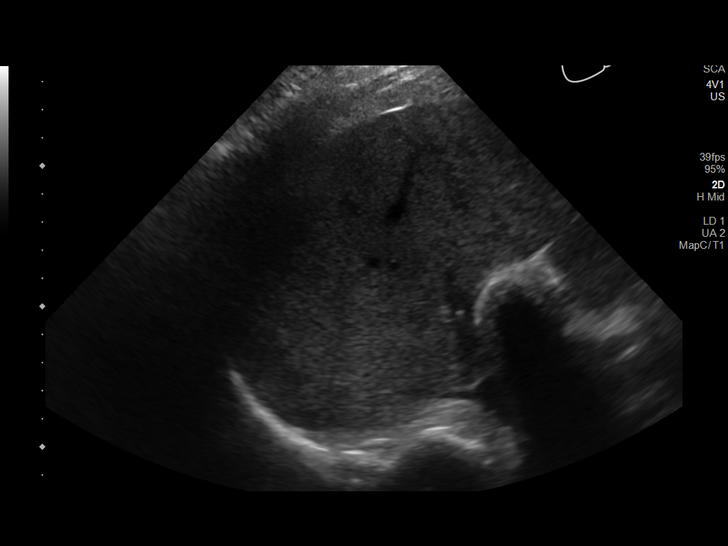
[im 19/28]
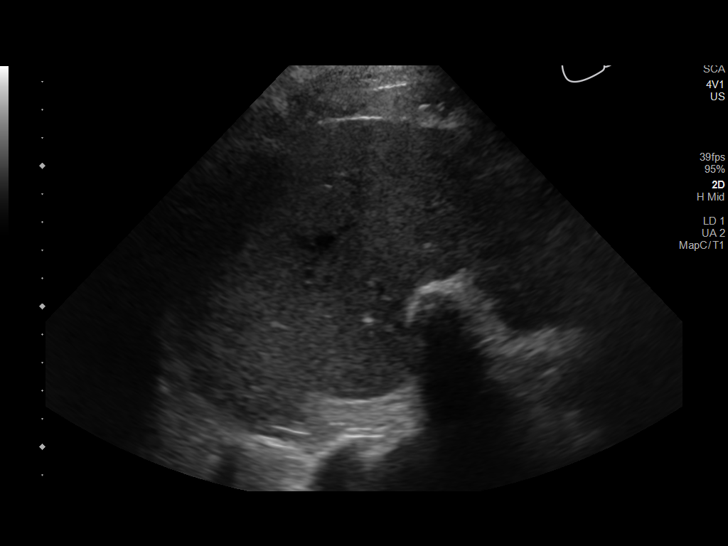
[im 21/28]
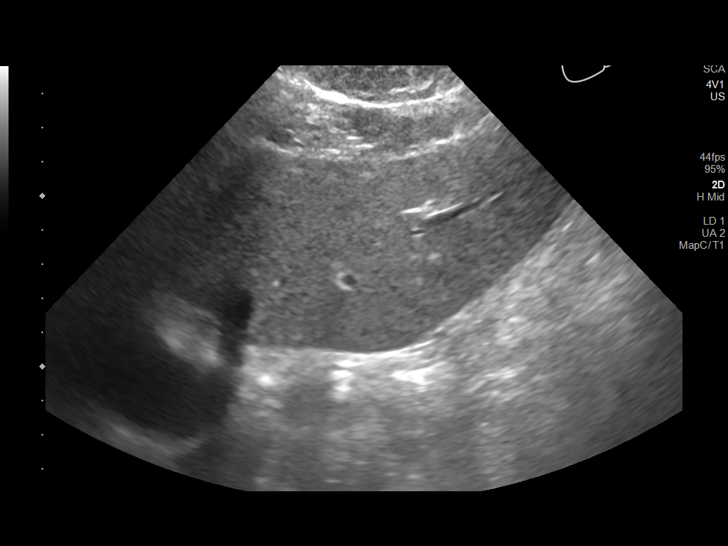
[im 23/28]
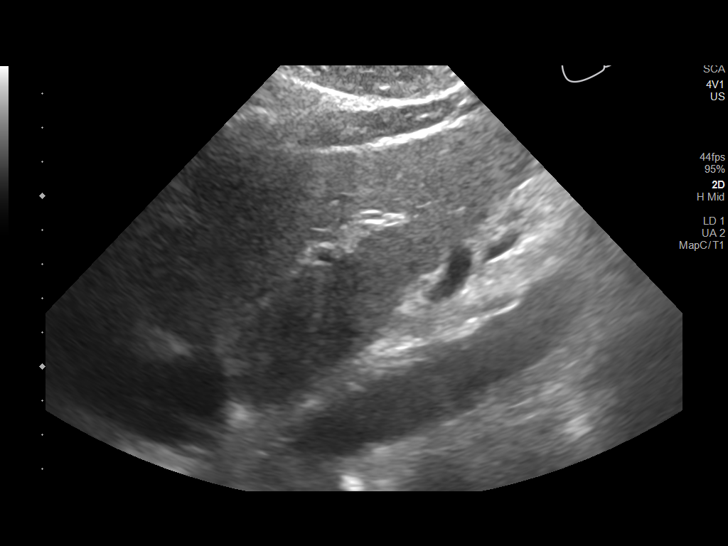
[im 25/28]
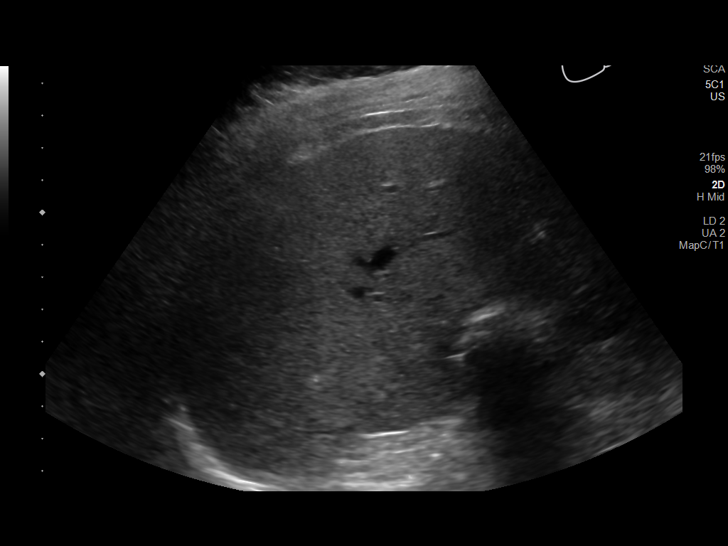
[im 28/28]
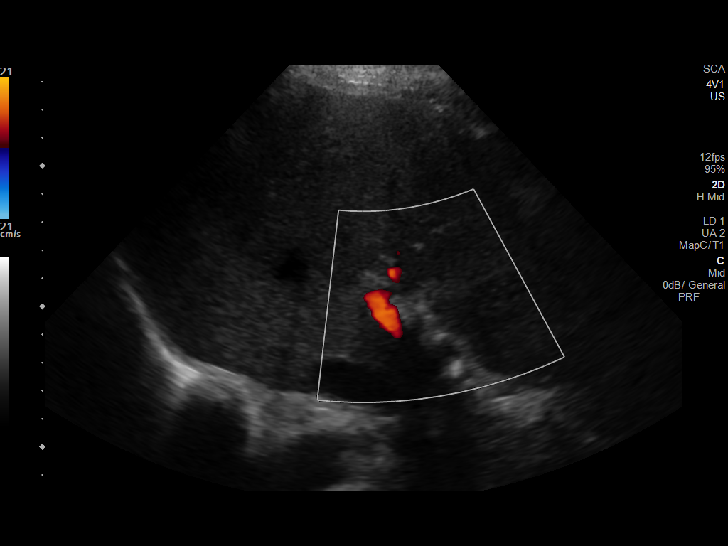

[14 of 25 positions shown; findings below may reference images not displayed]

FINDINGS: Gallbladder:

There is cholelithiasis without gallbladder wall thickening or
pericholecystic free fluid. The sonographic Murphy sign is reported
as negative.

Common bile duct:

Diameter: 6 mm

Liver:

No focal lesion identified. Within normal limits in parenchymal
echogenicity. Portal vein is patent on color Doppler imaging with
normal direction of blood flow towards the liver.

Other: None.
IMPRESSION: There is cholelithiasis without secondary signs of acute
cholecystitis.

## 2020-08-06 ENCOUNTER — Other Ambulatory Visit: Payer: Self-pay | Admitting: Family Medicine

## 2020-08-06 DIAGNOSIS — E7800 Pure hypercholesterolemia, unspecified: Secondary | ICD-10-CM

## 2020-08-24 ENCOUNTER — Ambulatory Visit
Admission: RE | Admit: 2020-08-24 | Discharge: 2020-08-24 | Disposition: A | Discharge status: Home / Self Care | Payer: No Typology Code available for payment source | Source: Ambulatory Visit | Attending: Family Medicine | Admitting: Family Medicine

## 2020-08-24 DIAGNOSIS — E7800 Pure hypercholesterolemia, unspecified: Secondary | ICD-10-CM

## 2020-09-06 IMAGING — RF DG CHOLANGIOGRAM OPERATIVE
1 series · 8 of 8 positions shown · non-contrast
Comparison: Ultrasound dated 01/29/2019

CLINICAL DATA: Intraoperative cholangiogram

EXAM:
INTRAOPERATIVE CHOLANGIOGRAM
TECHNIQUE: Cholangiographic images from the C-arm fluoroscopic device were
submitted for interpretation post-operatively. Please see the
procedural report for the amount of contrast and the fluoroscopy
time utilized.

[Series 1: run · 2 acquisitions, 8 frames shown]
[im 1/2]
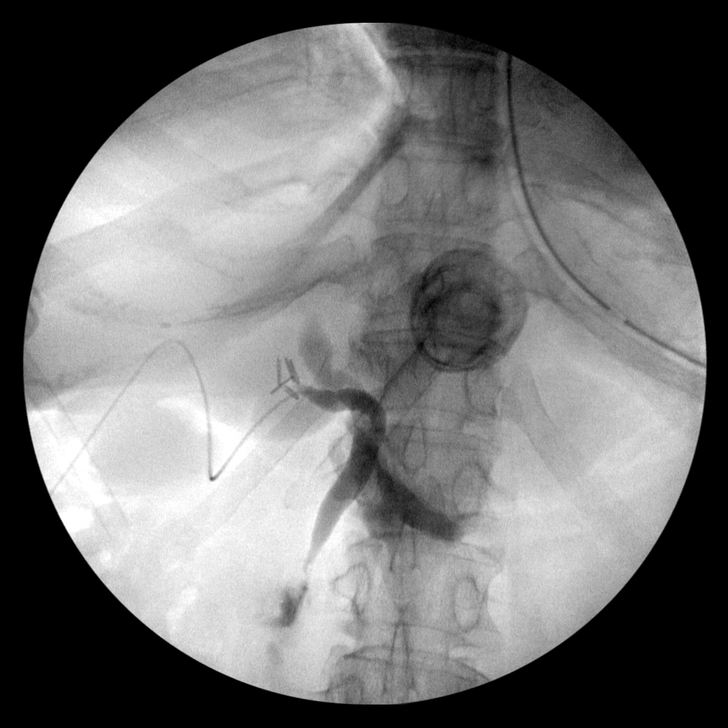
[im 1/2]
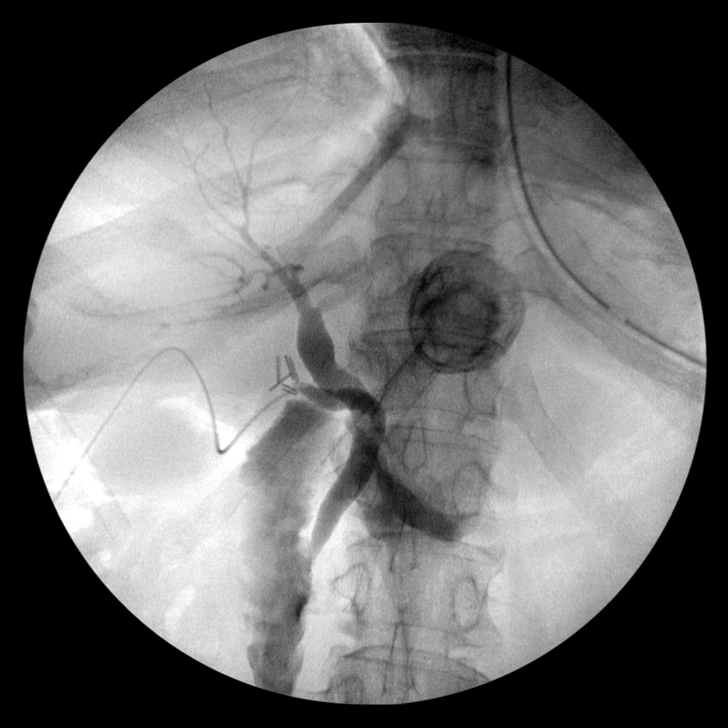
[im 1/2]
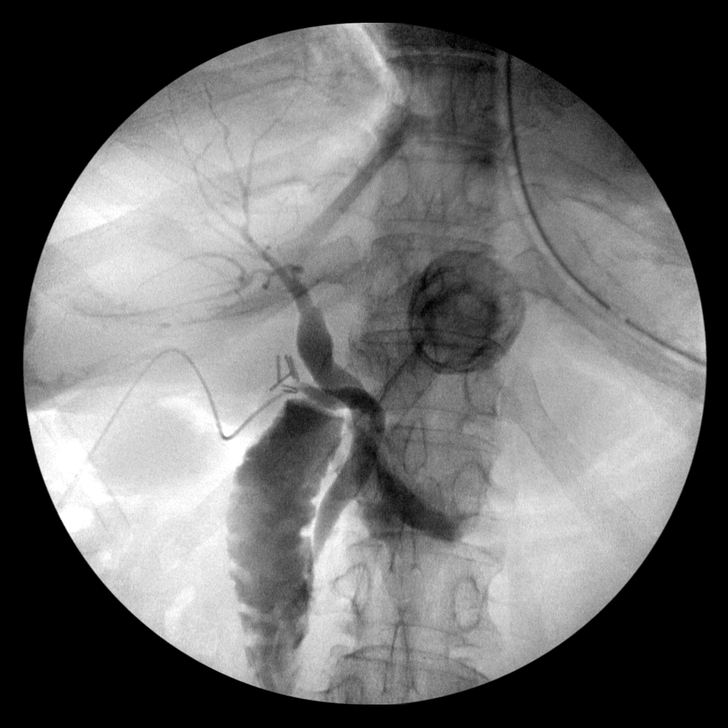
[im 1/2]
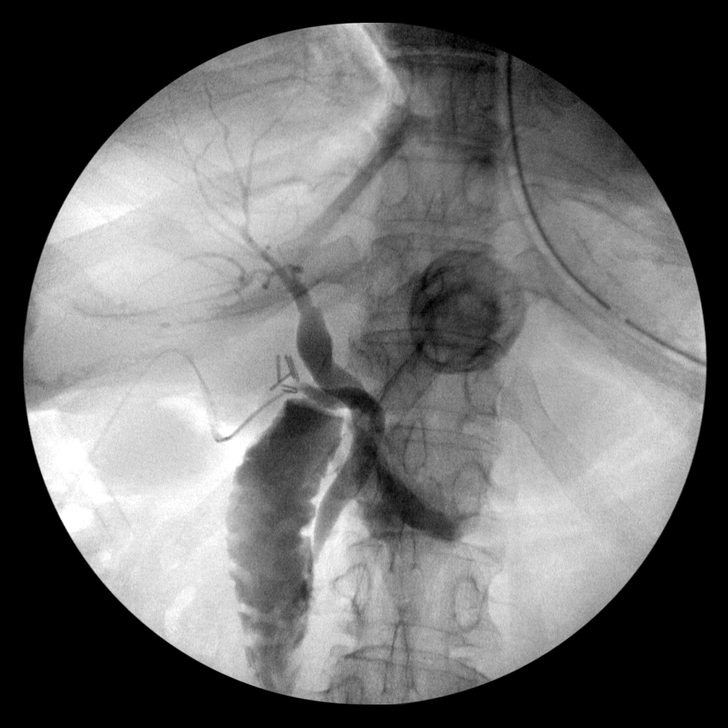
[im 2/2]
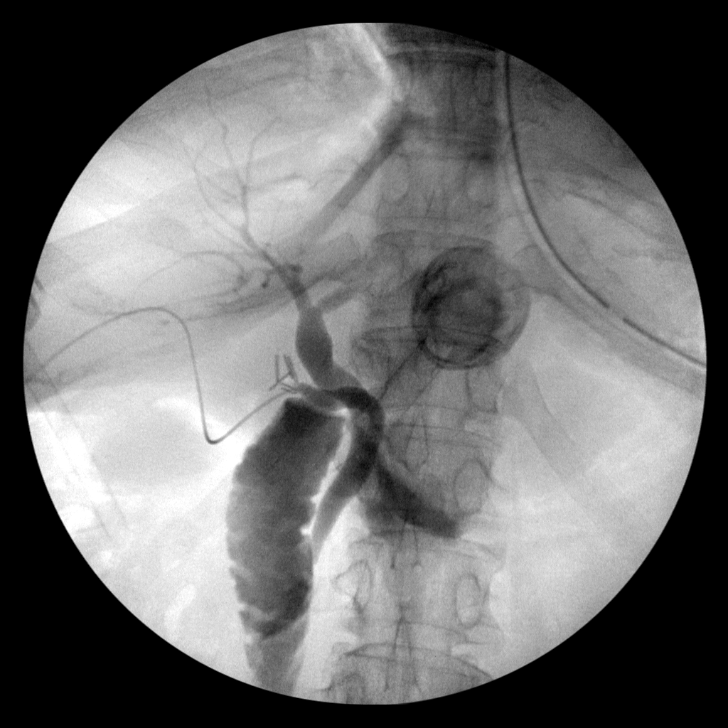
[im 2/2]
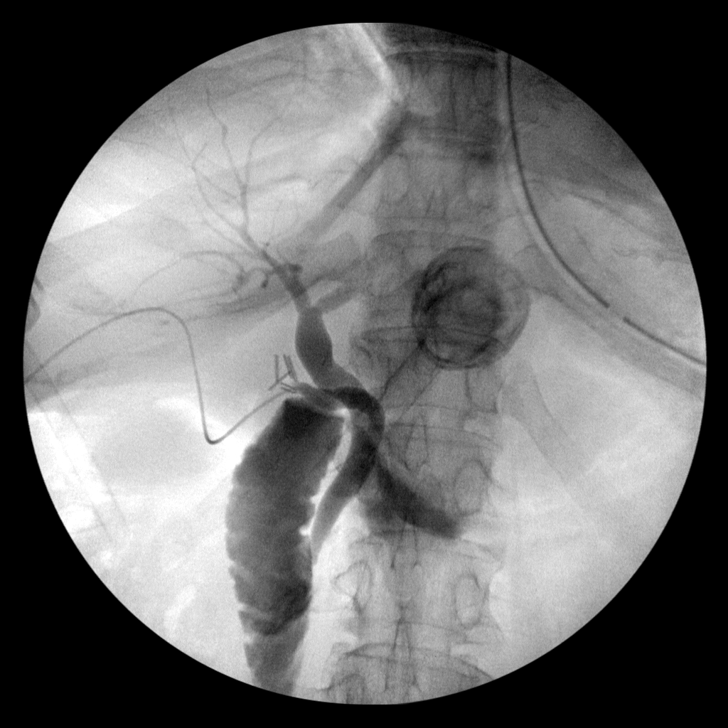
[im 2/2]
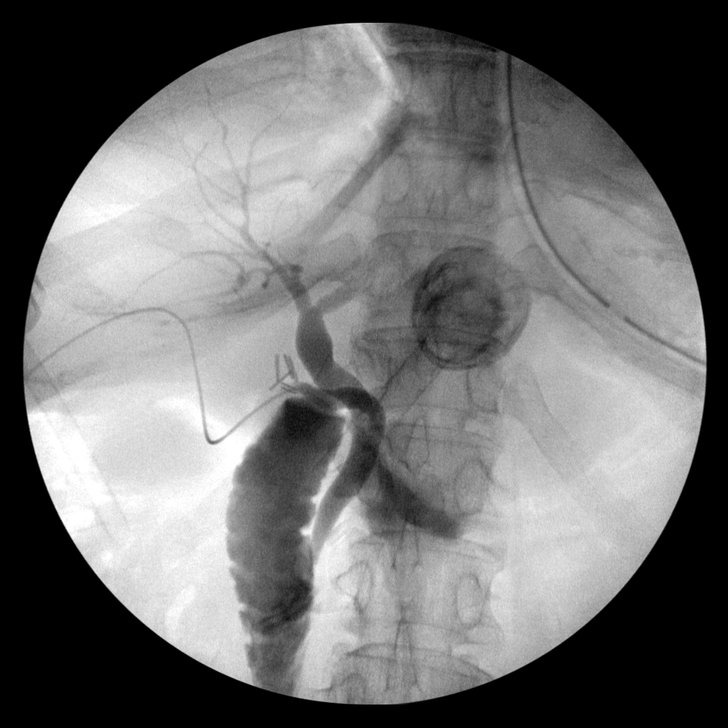
[im 2/2]
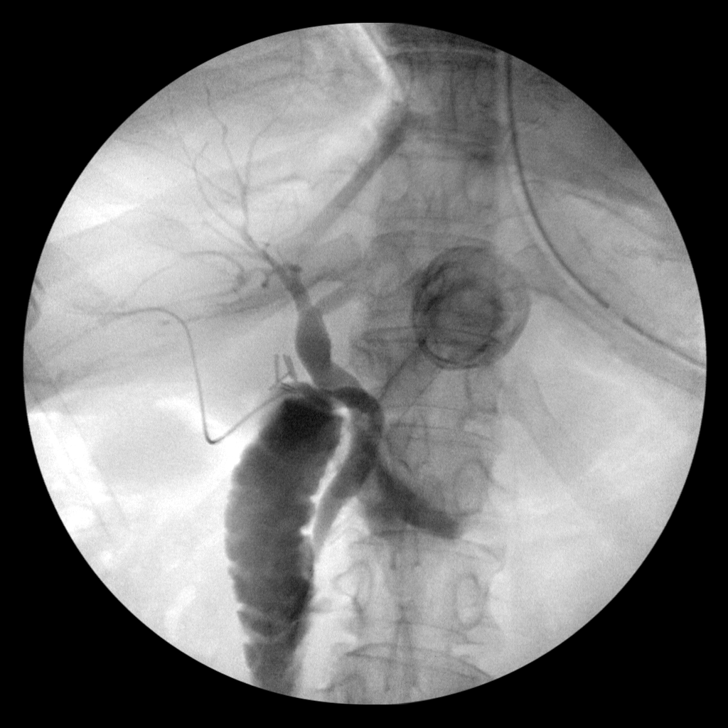

[8 of 8 positions shown; findings below may reference images not displayed]

FINDINGS: There is cannulation of the cystic duct with contrast injection. The
cystic duct is patent. There are no filling defects within the
common bile duct. Contrast is seen flowing easily into the small
bowel. There is no intrahepatic biliary ductal dilatation.
IMPRESSION: Intraoperative cholangiogram without evidence for a filling defect.

## 2020-09-20 ENCOUNTER — Other Ambulatory Visit: Payer: Self-pay | Admitting: Family Medicine

## 2020-09-20 DIAGNOSIS — R911 Solitary pulmonary nodule: Secondary | ICD-10-CM

## 2020-09-30 DIAGNOSIS — L821 Other seborrheic keratosis: Secondary | ICD-10-CM | POA: Diagnosis not present

## 2020-09-30 DIAGNOSIS — L668 Other cicatricial alopecia: Secondary | ICD-10-CM | POA: Diagnosis not present

## 2020-09-30 DIAGNOSIS — L723 Sebaceous cyst: Secondary | ICD-10-CM | POA: Diagnosis not present

## 2020-09-30 DIAGNOSIS — D2361 Other benign neoplasm of skin of right upper limb, including shoulder: Secondary | ICD-10-CM | POA: Diagnosis not present

## 2020-11-08 DIAGNOSIS — E7800 Pure hypercholesterolemia, unspecified: Secondary | ICD-10-CM | POA: Diagnosis not present

## 2020-11-08 DIAGNOSIS — E559 Vitamin D deficiency, unspecified: Secondary | ICD-10-CM | POA: Diagnosis not present

## 2020-11-10 DIAGNOSIS — Z23 Encounter for immunization: Secondary | ICD-10-CM | POA: Diagnosis not present

## 2020-11-15 DIAGNOSIS — H2513 Age-related nuclear cataract, bilateral: Secondary | ICD-10-CM | POA: Diagnosis not present

## 2020-11-15 DIAGNOSIS — H524 Presbyopia: Secondary | ICD-10-CM | POA: Diagnosis not present

## 2020-11-15 DIAGNOSIS — H5203 Hypermetropia, bilateral: Secondary | ICD-10-CM | POA: Diagnosis not present

## 2020-11-19 ENCOUNTER — Other Ambulatory Visit: Payer: Self-pay | Admitting: Family Medicine

## 2020-11-19 DIAGNOSIS — Z1231 Encounter for screening mammogram for malignant neoplasm of breast: Secondary | ICD-10-CM

## 2020-12-02 ENCOUNTER — Ambulatory Visit
Admission: RE | Admit: 2020-12-02 | Discharge: 2020-12-02 | Disposition: A | Discharge status: Home / Self Care | Payer: Medicare HMO | Source: Ambulatory Visit | Attending: Family Medicine | Admitting: Family Medicine

## 2020-12-02 ENCOUNTER — Other Ambulatory Visit: Payer: Medicare HMO

## 2020-12-02 DIAGNOSIS — R911 Solitary pulmonary nodule: Secondary | ICD-10-CM

## 2020-12-02 DIAGNOSIS — J479 Bronchiectasis, uncomplicated: Secondary | ICD-10-CM | POA: Diagnosis not present

## 2020-12-02 DIAGNOSIS — I700 Atherosclerosis of aorta: Secondary | ICD-10-CM | POA: Diagnosis not present

## 2020-12-21 ENCOUNTER — Other Ambulatory Visit: Payer: Self-pay | Admitting: Family Medicine

## 2020-12-21 DIAGNOSIS — E041 Nontoxic single thyroid nodule: Secondary | ICD-10-CM

## 2020-12-29 ENCOUNTER — Ambulatory Visit
Admission: RE | Admit: 2020-12-29 | Discharge: 2020-12-29 | Disposition: A | Discharge status: Home / Self Care | Payer: Medicare HMO | Source: Ambulatory Visit | Attending: Family Medicine | Admitting: Family Medicine

## 2020-12-29 DIAGNOSIS — E041 Nontoxic single thyroid nodule: Secondary | ICD-10-CM

## 2020-12-29 DIAGNOSIS — E042 Nontoxic multinodular goiter: Secondary | ICD-10-CM | POA: Diagnosis not present

## 2021-01-06 ENCOUNTER — Other Ambulatory Visit: Payer: Self-pay | Admitting: Family Medicine

## 2021-01-06 DIAGNOSIS — E041 Nontoxic single thyroid nodule: Secondary | ICD-10-CM

## 2021-01-13 ENCOUNTER — Ambulatory Visit: Payer: Medicare HMO

## 2021-01-13 ENCOUNTER — Other Ambulatory Visit: Payer: Self-pay

## 2021-01-13 ENCOUNTER — Ambulatory Visit
Admission: RE | Admit: 2021-01-13 | Discharge: 2021-01-13 | Disposition: A | Discharge status: Home / Self Care | Payer: Medicare HMO | Source: Ambulatory Visit | Attending: Family Medicine | Admitting: Family Medicine

## 2021-01-13 DIAGNOSIS — Z1231 Encounter for screening mammogram for malignant neoplasm of breast: Secondary | ICD-10-CM | POA: Diagnosis not present

## 2021-01-17 ENCOUNTER — Other Ambulatory Visit: Payer: Self-pay | Admitting: Family Medicine

## 2021-01-17 DIAGNOSIS — R928 Other abnormal and inconclusive findings on diagnostic imaging of breast: Secondary | ICD-10-CM

## 2021-01-20 ENCOUNTER — Other Ambulatory Visit (HOSPITAL_COMMUNITY)
Admission: RE | Admit: 2021-01-20 | Discharge: 2021-01-20 | Disposition: A | Discharge status: Home / Self Care | Payer: Medicare HMO | Source: Ambulatory Visit | Attending: Family Medicine | Admitting: Family Medicine

## 2021-01-20 ENCOUNTER — Other Ambulatory Visit: Payer: Self-pay

## 2021-01-20 ENCOUNTER — Ambulatory Visit
Admission: RE | Admit: 2021-01-20 | Discharge: 2021-01-20 | Disposition: A | Discharge status: Home / Self Care | Payer: Medicare HMO | Source: Ambulatory Visit | Attending: Family Medicine | Admitting: Family Medicine

## 2021-01-20 DIAGNOSIS — E041 Nontoxic single thyroid nodule: Secondary | ICD-10-CM

## 2021-01-25 LAB — CYTOLOGY - NON PAP

## 2021-02-02 ENCOUNTER — Ambulatory Visit
Admission: RE | Admit: 2021-02-02 | Discharge: 2021-02-02 | Disposition: A | Discharge status: Home / Self Care | Payer: Medicare HMO | Source: Ambulatory Visit | Attending: Family Medicine | Admitting: Family Medicine

## 2021-02-02 ENCOUNTER — Other Ambulatory Visit: Payer: Self-pay

## 2021-02-02 ENCOUNTER — Other Ambulatory Visit: Payer: Self-pay | Admitting: Family Medicine

## 2021-02-02 DIAGNOSIS — R928 Other abnormal and inconclusive findings on diagnostic imaging of breast: Secondary | ICD-10-CM

## 2021-02-02 DIAGNOSIS — R922 Inconclusive mammogram: Secondary | ICD-10-CM | POA: Diagnosis not present

## 2021-02-02 DIAGNOSIS — N6320 Unspecified lump in the left breast, unspecified quadrant: Secondary | ICD-10-CM

## 2021-02-08 ENCOUNTER — Other Ambulatory Visit: Payer: Self-pay | Admitting: Family Medicine

## 2021-02-08 ENCOUNTER — Other Ambulatory Visit: Payer: Self-pay

## 2021-02-08 ENCOUNTER — Ambulatory Visit
Admission: RE | Admit: 2021-02-08 | Discharge: 2021-02-08 | Disposition: A | Discharge status: Home / Self Care | Payer: Medicare HMO | Source: Ambulatory Visit | Attending: Family Medicine | Admitting: Family Medicine

## 2021-02-08 DIAGNOSIS — N6320 Unspecified lump in the left breast, unspecified quadrant: Secondary | ICD-10-CM

## 2021-02-08 DIAGNOSIS — C50412 Malignant neoplasm of upper-outer quadrant of left female breast: Secondary | ICD-10-CM | POA: Diagnosis not present

## 2021-02-08 DIAGNOSIS — N6321 Unspecified lump in the left breast, upper outer quadrant: Secondary | ICD-10-CM | POA: Diagnosis not present

## 2021-02-08 DIAGNOSIS — E041 Nontoxic single thyroid nodule: Secondary | ICD-10-CM

## 2021-02-08 HISTORY — PX: BREAST BIOPSY: SHX20

## 2021-02-11 ENCOUNTER — Telehealth: Payer: Self-pay | Admitting: Oncology

## 2021-02-11 NOTE — Telephone Encounter (Signed)
Spoke to patient to confirm morning clinic appointment for 9/21, packet sent via email

## 2021-02-14 ENCOUNTER — Encounter: Payer: Self-pay | Admitting: *Deleted

## 2021-02-14 DIAGNOSIS — C50412 Malignant neoplasm of upper-outer quadrant of left female breast: Secondary | ICD-10-CM | POA: Insufficient documentation

## 2021-02-14 DIAGNOSIS — Z170 Estrogen receptor positive status [ER+]: Secondary | ICD-10-CM | POA: Insufficient documentation

## 2021-02-15 NOTE — Progress Notes (Addendum)
[place]  Telephone:([phone]) [phone] Fax:(336) [phone]     ID: Teresa Knapp DOB: 11/18/1955  MR#: [id number]  PQZ#:[id number]  Patient Care Team: Kathyrn Lass, MD as PCP - General (Family Medicine) Mauro Kaufmann, RN as Oncology Nurse Navigator Rockwell Germany, RN as Oncology Nurse Navigator Erroll Luna, MD as Consulting Physician (General Surgery) Kailly Richoux, Virgie Dad, MD as Consulting Physician (Oncology) Kyung Rudd, MD as Consulting Physician (Radiation Oncology) Chauncey Cruel, MD OTHER MD:  CHIEF COMPLAINT: estrogen receptor positive breast cancer  CURRENT TREATMENT: Awaiting definitive surgery   HISTORY OF CURRENT ILLNESS: "Teresa Knapp" had routine screening mammography on 01/13/2021 showing a possible abnormality in the left breast. She underwent left diagnostic mammography with tomography and left breast ultrasonography at [facility] on 02/02/2021 showing: breast density category B; 9 mm irregular mass in left breast at 2 o'clock; no enlarged or morphologically abnormal lymph nodes.  Accordingly on 02/08/2021 she proceeded to biopsy of the left breast area in question. The pathology from this procedure ([id number]) showed: invasive ductal carcinoma with extracellular mucin, grade 2. Prognostic indicators significant for: estrogen receptor, 100% positive and progesterone receptor, 40% positive, both with strong staining intensity. Proliferation marker Ki67 at 15%. HER2 equivocal by immunohistochemistry (2+), but negative by fluorescent in situ hybridization with a signals ratio 1.04 and number per cell 1.35.  Cancer Staging Malignant neoplasm of upper-outer quadrant of left breast in female, estrogen receptor positive ([place]) Staging form: Breast, AJCC 8th Edition - Clinical stage from 02/16/2021: Stage IA (cT1b, cN0, cM0, G2, ER+, PR+, HER2-) - Signed by Chauncey Cruel, MD on 02/16/2021 Stage prefix: Initial diagnosis Method of lymph node  assessment: Clinical Histologic grading system: 3 grade system  The patient's subsequent history is as detailed below.   INTERVAL HISTORY: Teresa Knapp was evaluated in the multidisciplinary breast cancer clinic on 02/16/2021 accompanied by a good friend. Her case was also presented at the multidisciplinary breast cancer conference on the same day. At that time a preliminary plan was proposed: Breast conserving surgery, Oncotype, adjuvant radiation, antiestrogens.  The patient did not meet criteria for genetics testing.    REVIEW OF SYSTEMS: There were no specific symptoms leading to the original mammogram, which was routinely scheduled. The patient denies unusual headaches, visual changes, nausea, vomiting, stiff neck, dizziness, or gait imbalance. There has been no cough, phlegm production, or pleurisy, no chest pain or pressure, and no change in bowel or bladder habits. The patient denies fever, rash, bleeding, unexplained fatigue or unexplained weight loss. A detailed review of systems was otherwise entirely negative.   COVID 19 VACCINATION STATUS: Pfizer x4, most recently 10/2020   PAST MEDICAL HISTORY: Past Medical History:  Diagnosis Date   Family history of adverse reaction to anesthesia    mother had a possible reaction to anesthesia (possible jerking) not sure    PAST SURGICAL HISTORY: Past Surgical History:  Procedure Laterality Date   CESAREAN SECTION  1991 and 1992   CHOLECYSTECTOMY N/A 03/03/2019   Procedure: LAPAROSCOPIC CHOLECYSTECTOMY WITH INTRAOPERATIVE CHOLANGIOGRAM;  Surgeon: Armandina Gemma, MD;  Location: [facility];  Service: General;  Laterality: N/A;   lipoma removal     right side of neck   UMBILICAL HERNIA REPAIR N/A 03/03/2019   Procedure: PRIMARY REPAIR OF UMBILICAL HERNIA;  Surgeon: Armandina Gemma, MD;  Location: [facility];  Service: General;  Laterality: N/A;    FAMILY HISTORY: Family History  Problem Relation Age of Onset   Cancer Mother  lung   Hypertension  Mother    Heart attack Father    Breast cancer Maternal Grandmother    Diabetes Maternal Grandmother    Hypertension Maternal Grandmother    Her father died around age 29 from MI. Her mother died at age 81 from lung cancer. She was diagnosed at age 42 and was a tobacco user. Teresa Knapp had one brother and one sister, both are deceased. In addition to her mother, she reports breast cancer in her maternal grandmother.   GYNECOLOGIC HISTORY:  No LMP recorded. Patient is postmenopausal. Menarche: 65 years old Age at first live birth: 65 years old [place] P 2 LMP 09/2004 Contraceptive: used for 12 years HRT never used  Hysterectomy? no BSO? 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 Husband Teresa Knapp is retired from working in [employer] for [employer]. She lives at home with her husband Teresa Knapp who is retired from working in Programmer, applications.. Daughter Teresa Knapp, age 46, works in Investment banker, corporate in [place], [place]. Son Teresa Knapp, age 64, is a Marine stationed in [place]; he is in his 2nd of 4 years in [place]. Leiana has three step-grandchildren. She attends a [employer], [employer] church.    ADVANCED DIRECTIVES: In the absence of any documentation to the contrary, the patient's spouse is their HCPOA.    HEALTH MAINTENANCE: Social History   Tobacco Use   Smoking status: Never   Smokeless tobacco: Never  Vaping Use   Vaping Use: Never used  Substance Use Topics   Alcohol use: Yes    Alcohol/week: 1.0 standard drink    Types: 1 Glasses of wine per week    Comment: SOCIAL   Drug use: Never     Colonoscopy: 02/2015  PAP: 08/2018, negative  Bone density: 11/2018, -1.7   No Known Allergies  Current Outpatient Medications  Medication Sig Dispense Refill   metFORMIN (GLUCOPHAGE) 500 MG tablet Take 1 tablet by mouth daily.     rosuvastatin (CRESTOR) 10 MG tablet Take 1 tablet by mouth daily. (Patient not taking: Reported on 02/16/2021)     VITAMIN D PO  Take by mouth. Twice weekly     No current facility-administered medications for this visit.    OBJECTIVE: African-American woman who appears younger than stated age  59:   02/16/21 0903  BP: (!) 153/82  Pulse: 66  Resp: 18  Temp: 97.6 F (36.4 C)  SpO2: 97%     Body mass index is 28.89 kg/m.   Wt Readings from Last 3 Encounters:  02/16/21 173 lb 9.6 oz (78.7 kg)  10/01/19 175 lb (79.4 kg)  04/03/19 172 lb (78 kg)      ECOG FS:1 - Symptomatic but completely ambulatory  Ocular: Sclerae unicteric, pupils round and equal Ear-nose-throat: Wearing a mask Lymphatic: No cervical or supraclavicular adenopathy Lungs no rales or rhonchi Heart regular rate and rhythm Abd soft, nontender, positive bowel sounds MSK no focal spinal tenderness, no joint edema Neuro: non-focal, well-oriented, appropriate affect Breasts: The right breast is unremarkable.  The left breast is status post recent biopsy.  There is no skin or nipple change of concern.  Both axillae are benign   LAB RESULTS:  CMP     Component Value Date/Time   Knapp 142 02/16/2021 0835   K 4.2 02/16/2021 0835   CL 107 02/16/2021 0835   CO2 25 02/16/2021 0835   GLUCOSE 95 02/16/2021 0835   BUN 11 02/16/2021 0835  CREATININE 0.74 02/16/2021 0835   CALCIUM 10.0 02/16/2021 0835   PROT 7.8 02/16/2021 0835   ALBUMIN 4.2 02/16/2021 0835   AST 17 02/16/2021 0835   ALT 13 02/16/2021 0835   ALKPHOS 82 02/16/2021 0835   BILITOT 0.8 02/16/2021 0835   GFRNONAA >60 02/16/2021 0835   GFRAA >60 02/27/2019 0841    No results found for: Ronnald Ramp, A1GS, A2GS, BETS, BETA2SER, GAMS, MSPIKE, SPEI  Lab Results  Component Value Date   WBC 6.4 02/16/2021   NEUTROABS 3.5 02/16/2021   HGB 13.7 02/16/2021   HCT 40.9 02/16/2021   MCV 89.5 02/16/2021   PLT 253 02/16/2021    No results found for: LABCA2  No components found for: ACZYSA630  No results for input(s): INR in the last 168 hours.  No results found  for: LABCA2  No results found for: ZSW109  No results found for: NAT557  No results found for: DUK025  No results found for: CA2729  No components found for: HGQUANT  No results found for: CEA1 / No results found for: CEA1   No results found for: AFPTUMOR  No results found for: CHROMOGRNA  No results found for: KPAFRELGTCHN, LAMBDASER, KAPLAMBRATIO (kappa/lambda light chains)  No results found for: HGBA, HGBA2QUANT, HGBFQUANT, HGBSQUAN (Hemoglobinopathy evaluation)   No results found for: LDH  No results found for: IRON, TIBC, IRONPCTSAT (Iron and TIBC)  No results found for: FERRITIN  Urinalysis    Component Value Date/Time   COLORURINE YELLOW 01/29/2019 2210   APPEARANCEUR HAZY (A) 01/29/2019 2210   LABSPEC 1.014 01/29/2019 2210   PHURINE 6.0 01/29/2019 2210   GLUCOSEU NEGATIVE 01/29/2019 2210   HGBUR NEGATIVE 01/29/2019 2210   BILIRUBINUR NEGATIVE 01/29/2019 2210   KETONESUR 20 (A) 01/29/2019 2210   PROTEINUR NEGATIVE 01/29/2019 2210   NITRITE NEGATIVE 01/29/2019 2210   LEUKOCYTESUR LARGE (A) 01/29/2019 2210     STUDIES: [place] FNA BX THYROID 1ST LESION AFIRMA  Result Date: 01/20/2021 INDICATION: Left inferior thyroid nodule 2.0 cm EXAM: ULTRASOUND GUIDED FINE NEEDLE ASPIRATION OF INDETERMINATE THYROID NODULE COMPARISON:  US Thyroid 12/29/20 MEDICATIONS: 5 cc 1% lidocaine COMPLICATIONS: None immediate. TECHNIQUE: Informed written consent was obtained from the patient after a discussion of the risks, benefits and alternatives to treatment. Questions regarding the procedure were encouraged and answered. A timeout was performed prior to the initiation of the procedure. Pre-procedural ultrasound scanning demonstrated unchanged size and appearance of the indeterminate nodule within the left thyroid The procedure was planned. The neck was prepped in the usual sterile fashion, and a sterile drape was applied covering the operative field. A timeout was performed prior to the  initiation of the procedure. Local anesthesia was provided with 1% lidocaine. Under direct ultrasound guidance, 5 FNA biopsies were performed of the left inferior thyroid nodule with a 27 gauge needle. 2 of these samples were obtained for [facility] Multiple ultrasound images were saved for procedural documentation purposes. The samples were prepared and submitted to pathology. Limited post procedural scanning was negative for hematoma or additional complication. Dressings were placed. The patient tolerated the above procedures procedure well without immediate postprocedural complication. FINDINGS: Nodule reference number based on prior diagnostic ultrasound: 5 Maximum size: 2.0 cm Location: Left; Inferior ACR TI-RADS risk category: TR4 (4-6 points) Reason for biopsy: meets ACR TI-RADS criteria Ultrasound imaging confirms appropriate placement of the needles within the thyroid nodule. IMPRESSION: Technically successful ultrasound guided fine needle aspiration of left inferior thyroid nodule Read by Lavonia Drafts [facility] Electronically Signed  By: Corrie Mckusick D.O.   On: 01/20/2021 16:25   US BREAST LTD UNI LEFT INC AXILLA  Result Date: 02/02/2021 CLINICAL DATA:  Patient returns today to evaluate a possible LEFT breast mass questioned on recent screening mammogram. EXAM: DIGITAL DIAGNOSTIC UNILATERAL LEFT MAMMOGRAM WITH TOMOSYNTHESIS AND CAD; ULTRASOUND LEFT BREAST LIMITED TECHNIQUE: Left digital diagnostic mammography and breast tomosynthesis was performed. The images were evaluated with computer-aided detection.; Targeted ultrasound examination of the left breast was performed. COMPARISON:  Previous exams including recent screening mammogram dated 01/13/2021. ACR Breast Density Category b: There are scattered areas of fibroglandular density. FINDINGS: On today's additional diagnostic views, a partially obscured mass is confirmed within the upper-outer quadrant of the LEFT breast, 1-2 o'clock axis region, measuring  approximately 6 mm greatest dimension. Targeted ultrasound is performed, showing an irregular mass within the LEFT breast at the 2 o'clock axis, 6 cm from the nipple, measuring 9 x 4 x 6 mm, corresponding to the mammographic finding. LEFT axilla was evaluated with ultrasound showing no enlarged or morphologically abnormal lymph nodes. IMPRESSION: Irregular mass within the LEFT breast at the 2 o'clock axis, 6 cm from the nipple, measuring 9 mm, corresponding to the mammographic finding. This is a suspicious finding for which ultrasound-guided biopsy is recommended. RECOMMENDATION: Ultrasound-guided biopsy for the LEFT breast mass at the 2 o'clock axis, 6 cm from the nipple, measuring 9 mm. Ultrasound-guided biopsy is scheduled for September 13. I have discussed the findings and recommendations with the patient. If applicable, a reminder letter will be sent to the patient regarding the next appointment. BI-RADS CATEGORY  4: Suspicious. Electronically Signed   By: Franki Cabot M.D.   On: 02/02/2021 16:26  MM DIAG BREAST TOMO UNI LEFT  Result Date: 02/02/2021 CLINICAL DATA:  Patient returns today to evaluate a possible LEFT breast mass questioned on recent screening mammogram. EXAM: DIGITAL DIAGNOSTIC UNILATERAL LEFT MAMMOGRAM WITH TOMOSYNTHESIS AND CAD; ULTRASOUND LEFT BREAST LIMITED TECHNIQUE: Left digital diagnostic mammography and breast tomosynthesis was performed. The images were evaluated with computer-aided detection.; Targeted ultrasound examination of the left breast was performed. COMPARISON:  Previous exams including recent screening mammogram dated 01/13/2021. ACR Breast Density Category b: There are scattered areas of fibroglandular density. FINDINGS: On today's additional diagnostic views, a partially obscured mass is confirmed within the upper-outer quadrant of the LEFT breast, 1-2 o'clock axis region, measuring approximately 6 mm greatest dimension. Targeted ultrasound is performed, showing an  irregular mass within the LEFT breast at the 2 o'clock axis, 6 cm from the nipple, measuring 9 x 4 x 6 mm, corresponding to the mammographic finding. LEFT axilla was evaluated with ultrasound showing no enlarged or morphologically abnormal lymph nodes. IMPRESSION: Irregular mass within the LEFT breast at the 2 o'clock axis, 6 cm from the nipple, measuring 9 mm, corresponding to the mammographic finding. This is a suspicious finding for which ultrasound-guided biopsy is recommended. RECOMMENDATION: Ultrasound-guided biopsy for the LEFT breast mass at the 2 o'clock axis, 6 cm from the nipple, measuring 9 mm. Ultrasound-guided biopsy is scheduled for September 13. I have discussed the findings and recommendations with the patient. If applicable, a reminder letter will be sent to the patient regarding the next appointment. BI-RADS CATEGORY  4: Suspicious. Electronically Signed   By: Franki Cabot M.D.   On: 02/02/2021 16:26  MM CLIP PLACEMENT LEFT  Result Date: 02/08/2021 CLINICAL DATA:  Evaluate RIBBON biopsy clip placement following ultrasound-guided LEFT breast biopsy. EXAM: 3D DIAGNOSTIC LEFT MAMMOGRAM POST ULTRASOUND BIOPSY COMPARISON:  Previous exam(s). FINDINGS: 3D Mammographic images were obtained following ultrasound guided biopsy of the 0.9 cm mass at the 2 o'clock position of the LEFT breast. The RIBBON biopsy marking clip is in expected position at the site of biopsy. IMPRESSION: Appropriate positioning of the RIBBON shaped biopsy marking clip at the site of biopsy in the UPPER-OUTER LEFT breast. Final Assessment: Post Procedure Mammograms for Marker Placement Electronically Signed   By: Margarette Canada M.D.   On: 02/08/2021 09:13  [place] LT BREAST BX W LOC DEV 1ST LESION IMG BX SPEC US GUIDE  Addendum Date: 02/09/2021   ADDENDUM REPORT: 02/09/2021 11:40 ADDENDUM: Pathology revealed GRADE II INVASIVE DUCTAL CARCINOMA WITH EXTRACELLULAR MUCIN of the left BREAST, upper outer, 2:00 o'clock. This was found to be  concordant by Dr. Hassan Rowan. Pathology results were discussed with the patient by telephone. The patient reported doing well after the biopsy with tenderness at the site. Post biopsy instructions and care were reviewed and questions were answered. The patient was encouraged to call [facility] for any additional concerns. The patient was referred to [facility] on February 16, 2021. Pathology results reported by Stacie Acres RN on 02/09/2021. Electronically Signed   By: Margarette Canada M.D.   On: 02/09/2021 11:40   Result Date: 02/09/2021 CLINICAL DATA:  65 year old female for tissue sampling of 0.9 cm UPPER-OUTER LEFT breast mass. EXAM: ULTRASOUND GUIDED LEFT BREAST CORE NEEDLE BIOPSY COMPARISON:  Previous exam(s). FINDINGS: I met with the patient and we discussed the procedure of ultrasound-guided biopsy, including benefits and alternatives. We discussed the high likelihood of a successful procedure. We discussed the risks of the procedure, including infection, bleeding, tissue injury, clip migration, and inadequate sampling. Informed written consent was given. The usual time-out protocol was performed immediately prior to the procedure. Lesion quadrant: UPPER-OUTER LEFT breast Using sterile technique and 1% Lidocaine as local anesthetic, under direct ultrasound visualization, a 12 gauge spring-loaded device was used to perform biopsy of the 0.9 cm mass at the 2 o'clock position of the LEFT breast 6 cm from the nipple using a MEDIAL approach. At the conclusion of the procedure a RIBBON tissue marker clip was deployed into the biopsy cavity. Follow up 2 view mammogram was performed and dictated separately. IMPRESSION: Ultrasound guided biopsy of 0.9 cm UPPER-OUTER LEFT breast mass. No apparent complications. Electronically Signed: By: Margarette Canada M.D. On: 02/08/2021 09:11    ELIGIBLE FOR AVAILABLE RESEARCH  PROTOCOL: no  ASSESSMENT: 65 y.o. [place] woman status post left breast upper outer quadrant biopsy 02/08/2021 for a clinical T1b N0, stage IA invasive ductal carcinoma, grade 2, estrogen and progesterone receptor positive, HER2 not amplified, with an MIB-1 of 15%.  (1) definitive surgery pending  (2) Oncotype to be obtained from the final surgical sample  (3) adjuvant radiation as appropriate  (4) antiestrogens  (5) pulmonary nodule 0.2 cm incidentally noted on 08/24/2020 scan  (a) stable on repeat noncontrast CT of the chest 12/02/2020  PLAN: I met today with Teresa Knapp to review her new diagnosis. Specifically we discussed the biology of her breast cancer, its diagnosis, staging, treatment  options and prognosis.We first reviewed the fact that cancer is not one disease but more than 100 different diseases and that it is important to keep them separate-- otherwise when friends and relatives discuss their own cancer experiences with Hermie confusion can result. Similarly we explained that if breast cancer spreads to the  bone or liver, the patient would not have bone cancer or liver cancer, but breast cancer in the bone and breast cancer in the liver: one cancer in three places-- not 3 different cancers which otherwise would have to be treated in 3 different ways.  We discussed the difference between local and systemic therapy. In terms of loco-regional treatment, lumpectomy plus radiation is equivalent to mastectomy as far as survival is concerned. For this reason, and because the cosmetic results are generally superior, we recommend breast conserving surgery.   We then discussed the rationale for systemic therapy. There is some risk that this cancer may have already spread to other parts of her body. Patients frequently ask at this point about bone scans, CAT scans and PET scans to find out if they have occult breast cancer somewhere else. The problem is that in early stage disease we are much more  likely to find false positives then true cancers and this would expose the patient to unnecessary procedures as well as unnecessary radiation. Scans cannot answer the question the patient really would like to know, which is whether she has microscopic disease elsewhere in her body. For those reasons we do not recommend them.  Of course we would proceed to aggressive evaluation of any symptoms that might suggest metastatic disease, but that is not the case here.  Next we went over the options for systemic therapy which are anti-estrogens, anti-HER-2 immunotherapy, and chemotherapy. Malaika does not meet criteria for anti-HER-2 immunotherapy. She is a good candidate for anti-estrogens.  The question of chemotherapy is more complicated. Chemotherapy is most effective in rapidly growing, aggressive tumors. It is much less effective in low-grade, slow growing cancers. Laurine 's cancer is intermediate. For that reason we are going to request an Oncotype from the definitive surgical sample, as suggested by NCCN guidelines. That will help [place] make a definitive decision regarding chemotherapy in this case.  Destinee has a good understanding of the overall plan. She agrees with it. She knows the goal of treatment in her case is cure. She will call with any problems that may develop before her next visit here.  Total encounter time 65 minutes.Sarajane Jews C. Taesha Goodell, MD 02/16/2021 11:29 AM Medical Oncology and Hematology [facility] [place], [place] [address] Tel. [phone]    Fax. [phone]   This document serves as a record of services personally performed by Lurline Del, MD. It was created on his behalf by Wilburn Mylar, a trained medical scribe. The creation of this record is based on the scribe's personal observations and the provider's statements to them.   I, Lurline Del MD, have reviewed the above documentation for accuracy and completeness, and I agree with the  above.    *Total Encounter Time as defined by the Centers for Medicare and Medicaid Services includes, in addition to the face-to-face time of a patient visit (documented in the note above) non-face-to-face time: obtaining and reviewing outside history, ordering and reviewing medications, tests or procedures, care coordination (communications with other health care professionals or caregivers) and documentation in the medical record.

## 2021-02-16 ENCOUNTER — Encounter: Payer: Self-pay | Admitting: General Practice

## 2021-02-16 ENCOUNTER — Inpatient Hospital Stay: Payer: Medicare HMO | Attending: Oncology

## 2021-02-16 ENCOUNTER — Encounter: Payer: Self-pay | Admitting: Physical Therapy

## 2021-02-16 ENCOUNTER — Inpatient Hospital Stay (HOSPITAL_BASED_OUTPATIENT_CLINIC_OR_DEPARTMENT_OTHER): Payer: Medicare HMO | Admitting: Oncology

## 2021-02-16 ENCOUNTER — Ambulatory Visit: Payer: Medicare HMO | Attending: Surgery | Admitting: Physical Therapy

## 2021-02-16 ENCOUNTER — Encounter: Payer: Self-pay | Admitting: *Deleted

## 2021-02-16 ENCOUNTER — Other Ambulatory Visit: Payer: Self-pay

## 2021-02-16 ENCOUNTER — Other Ambulatory Visit: Payer: Self-pay | Admitting: *Deleted

## 2021-02-16 ENCOUNTER — Ambulatory Visit: Payer: Self-pay | Admitting: Surgery

## 2021-02-16 ENCOUNTER — Ambulatory Visit
Admission: RE | Admit: 2021-02-16 | Discharge: 2021-02-16 | Disposition: A | Discharge status: Home / Self Care | Payer: Medicare HMO | Source: Ambulatory Visit | Attending: Radiation Oncology | Admitting: Radiation Oncology

## 2021-02-16 VITALS — BP 153/82 | HR 66 | Temp 97.6°F | Resp 18 | Ht 65.0 in | Wt 173.6 lb

## 2021-02-16 DIAGNOSIS — C50412 Malignant neoplasm of upper-outer quadrant of left female breast: Secondary | ICD-10-CM | POA: Diagnosis not present

## 2021-02-16 DIAGNOSIS — Z170 Estrogen receptor positive status [ER+]: Secondary | ICD-10-CM

## 2021-02-16 DIAGNOSIS — Z801 Family history of malignant neoplasm of trachea, bronchus and lung: Secondary | ICD-10-CM

## 2021-02-16 DIAGNOSIS — R293 Abnormal posture: Secondary | ICD-10-CM | POA: Insufficient documentation

## 2021-02-16 DIAGNOSIS — Z803 Family history of malignant neoplasm of breast: Secondary | ICD-10-CM | POA: Diagnosis not present

## 2021-02-16 LAB — CBC WITH DIFFERENTIAL (CANCER CENTER ONLY)
Abs Immature Granulocytes: 0.02 10*3/uL (ref 0.00–0.07)
Basophils Absolute: 0 10*3/uL (ref 0.0–0.1)
Basophils Relative: 1 %
Eosinophils Absolute: 0.1 10*3/uL (ref 0.0–0.5)
Eosinophils Relative: 1 %
HCT: 40.9 % (ref 36.0–46.0)
Hemoglobin: 13.7 g/dL (ref 12.0–15.0)
Immature Granulocytes: 0 %
Lymphocytes Relative: 37 %
Lymphs Abs: 2.3 10*3/uL (ref 0.7–4.0)
MCH: 30 pg (ref 26.0–34.0)
MCHC: 33.5 g/dL (ref 30.0–36.0)
MCV: 89.5 fL (ref 80.0–100.0)
Monocytes Absolute: 0.4 10*3/uL (ref 0.1–1.0)
Monocytes Relative: 7 %
Neutro Abs: 3.5 10*3/uL (ref 1.7–7.7)
Neutrophils Relative %: 54 %
Platelet Count: 253 10*3/uL (ref 150–400)
RBC: 4.57 MIL/uL (ref 3.87–5.11)
RDW: 12.2 % (ref 11.5–15.5)
WBC Count: 6.4 10*3/uL (ref 4.0–10.5)
nRBC: 0 % (ref 0.0–0.2)

## 2021-02-16 LAB — CMP (CANCER CENTER ONLY)
ALT: 13 U/L (ref 0–44)
AST: 17 U/L (ref 15–41)
Albumin: 4.2 g/dL (ref 3.5–5.0)
Alkaline Phosphatase: 82 U/L (ref 38–126)
Anion gap: 10 (ref 5–15)
BUN: 11 mg/dL (ref 8–23)
CO2: 25 mmol/L (ref 22–32)
Calcium: 10 mg/dL (ref 8.9–10.3)
Chloride: 107 mmol/L (ref 98–111)
Creatinine: 0.74 mg/dL (ref 0.44–1.00)
GFR, Estimated: >60 mL/min (ref >60)
Glucose, Bld: 95 mg/dL (ref 70–99)
Potassium: 4.2 mmol/L (ref 3.5–5.1)
Sodium: 142 mmol/L (ref 135–145)
Total Bilirubin: 0.8 mg/dL (ref 0.3–1.2)
Total Protein: 7.8 g/dL (ref 6.5–8.1)

## 2021-02-16 LAB — GENETIC SCREENING ORDER

## 2021-02-16 NOTE — Patient Instructions (Signed)

## 2021-02-16 NOTE — Progress Notes (Signed)
[place] Work  Initial Assessment  Assessment performed by [employer] Intern Rosary Lively. Notes entered by Edwyna Shell due to Intern not having full EPIC access at this time.  Teresa Knapp is a 65 y.o. year old female accompanied by best friend Teresa Knapp. Social Work was referred by [facility] for assessment of psychosocial needs.   SDOH (Social Determinants of Health) assessments performed: Yes SDOH Interventions    Flowsheet Row Most Recent Value  SDOH Interventions   Food Insecurity Interventions Intervention Not Indicated  Financial Strain Interventions Intervention Not Indicated  Housing Interventions Intervention Not Indicated  Transportation Interventions Intervention Not Indicated       Distress Screen completed: Yes ONCBCN DISTRESS SCREENING 02/16/2021  Screening Type Initial Screening  Distress experienced in past week (1-10) 1      Family/Social Information:  Housing Arrangement: patient lives with husband. Family members/support persons in your life? Family and Friends/Colleagues Transportation concerns: no  Employment: Working part time as a Oceanographer. Income source: Employment Financial concerns: No Type of concern: None Food access concerns: no Religious or spiritual practice: not addressed  Medication Concerns: no  Services Currently in place:  N/A  Coping/ Adjustment to diagnosis: Patient understands treatment plan and what happens next? yes Concerns about diagnosis and/or treatment:  Patient expressed desire to finish treatment as soon as possible so that she can return to her regular routines.  Patient reported stressors:  none Hopes and priorities: Starting and finishing treatment as soon as possible. Patient enjoys being outside, exercise, time with family/ friends, and yoga, time at the beach. Current coping skills/ strengths: Ability for insight , Capable of independent living , Motivation for treatment/growth , and  Supportive family/friends     SUMMARY: Current SDOH Barriers:  None identified at this time.  Clinical Social Work Clinical Goal(s):  None at this time  Interventions: Discussed common feeling and emotions when being diagnosed with cancer, and the importance of support during treatment Informed patient of the support team roles and support services at [facility] Provided CSW contact information and encouraged patient to call with any questions or concerns   Follow Up Plan:  BSW Intern will call patient in a few weeks to check in. Patient verbalizes understanding of plan: Yes   Rosary Lively, [place] , LCSW

## 2021-02-16 NOTE — Progress Notes (Signed)
Radiation Oncology         [phone] ________________________________  Name: Teresa Knapp        MRN: [id number]  Date of Service: 02/16/2021 DOB: 08/24/1955  CC:Miller, Lisa, MD  Cornett, Thomas, MD     REFERRING PHYSICIAN: Cornett, Thomas, MD   DIAGNOSIS: The encounter diagnosis was Malignant neoplasm of upper-outer quadrant of left breast in female, estrogen receptor positive (HCC).   HISTORY OF PRESENT ILLNESS: Teresa Knapp is a 64 y.o. female seen in the multidisciplinary breast clinic for a new diagnosis of left breast cancer. The patient was noted to have a possible mass on screening mammogram that showed a 9 mm mass by ultrasound in the 2:00 position of the left breast and the axilla was negative for adenopathy. 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 The tumor was ER/PR positive, HER2 negative with a Kii 67 of 15%. She's seen today to discuss treatment of her cancer.     PREVIOUS RADIATION THERAPY: No   PAST MEDICAL HISTORY:  Past Medical History:  Diagnosis Date   Family history of adverse reaction to anesthesia    mother had a possible reaction to anesthesia (possible jerking) not sure       PAST SURGICAL HISTORY: Past Surgical History:  Procedure Laterality Date   CESAREAN SECTION  1991 and 1992   CHOLECYSTECTOMY N/A 03/03/2019   Procedure: LAPAROSCOPIC CHOLECYSTECTOMY WITH INTRAOPERATIVE CHOLANGIOGRAM;  Surgeon: Gerkin, Todd, MD;  Location: [facility];  Service: General;  Laterality: N/A;   lipoma removal     right side of neck   UMBILICAL HERNIA REPAIR N/A 03/03/2019   Procedure: PRIMARY REPAIR OF UMBILICAL HERNIA;  Surgeon: Gerkin, Todd, MD;  Location: [facility];  Service: General;  Laterality: N/A;     FAMILY HISTORY:  Family History  Problem Relation Age of Onset   Cancer Mother        lung   Hypertension Mother    Heart attack Father    Breast cancer Maternal Grandmother     Diabetes Maternal Grandmother    Hypertension Maternal Grandmother      SOCIAL HISTORY:  reports that she has never smoked. She has never used smokeless tobacco. She reports current alcohol use of about 1.0 standard drink per week. She reports that she does not use drugs. The patient is married and lives in [place]. 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 She's accompanied by her friend Sandra.    ALLERGIES: Patient has no known allergies.   MEDICATIONS:  Current Outpatient Medications  Medication Sig Dispense Refill   metFORMIN (GLUCOPHAGE) 500 MG tablet Take 1 tablet by mouth daily.     VITAMIN D PO Take by mouth. Twice weekly     No current facility-administered medications for this encounter.     REVIEW OF SYSTEMS: On review of systems, the patient reports that she is doing well overall. She is getting worked up for some thyroid abnormalities but is hopeful that that is not connected to her breast diagnosis. No other specific breast complaints are verbalized.      PHYSICAL EXAM:  Wt Readings from Last 3 Encounters:  10/01/19 175 lb (79.4 kg)  04/03/19 172 lb (78 kg)  03/03/19 170 lb 1 oz (77.1 kg)   Temp Readings from Last 3 Encounters:  03/04/19 98.2 F (36.8 C) (Oral)  02/27/19 98.5 F (36.9 C) (Oral)  01/29/19 98.8 F (37.1 C) (Oral)     BP Readings from Last 3 Encounters:  10/01/19 124/80  03/04/19 115/71  02/27/19 132/75   Pulse Readings from Last 3 Encounters:  03/04/19 74  02/27/19 71  01/29/19 71    In general this is a well appearing African American female in no acute distress. She's alert and oriented x4 and appropriate throughout the examination. Cardiopulmonary assessment is negative for acute distress and she exhibits normal effort. Bilateral breast exam is deferred.    ECOG = 0  0 - Asymptomatic (Fully active, able to carry on all predisease activities without restriction)  1 - Symptomatic but completely  ambulatory (Restricted in physically strenuous activity but ambulatory and able to carry out work of a light or sedentary nature. For example, light housework, office work)  2 - Symptomatic, <50% in bed during the day (Ambulatory and capable of all self care but unable to carry out any work activities. Up and about more than 50% of waking hours)  3 - Symptomatic, >50% in bed, but not bedbound (Capable of only limited self-care, confined to bed or chair 50% or more of waking hours)  4 - Bedbound (Completely disabled. Cannot carry on any self-care. Totally confined to bed or chair)  5 - Death   Eustace Pen MM, Creech RH, Tormey DC, et al. [phone]). "Toxicity and response criteria of the [facility]". [place] Oncol. 5 (6): 649-55    LABORATORY DATA:  Lab Results  Component Value Date   WBC 6.2 02/27/2019   HGB 13.6 02/27/2019   HCT 42.5 02/27/2019   MCV 94.2 02/27/2019   PLT 236 02/27/2019   Lab Results  Component Value Date   NA 139 02/27/2019   K 4.1 02/27/2019   CL 106 02/27/2019   CO2 25 02/27/2019   Lab Results  Component Value Date   ALT 17 01/29/2019   AST 23 01/29/2019   ALKPHOS 73 01/29/2019   BILITOT 0.6 01/29/2019      RADIOGRAPHY: [place] FNA BX THYROID 1ST LESION AFIRMA  Result Date: 01/20/2021 INDICATION: Left inferior thyroid nodule 2.0 cm EXAM: ULTRASOUND GUIDED FINE NEEDLE ASPIRATION OF INDETERMINATE THYROID NODULE COMPARISON:  US Thyroid 12/29/20 MEDICATIONS: 5 cc 1% lidocaine COMPLICATIONS: None immediate. TECHNIQUE: Informed written consent was obtained from the patient after a discussion of the risks, benefits and alternatives to treatment. Questions regarding the procedure were encouraged and answered. A timeout was performed prior to the initiation of the procedure. Pre-procedural ultrasound scanning demonstrated unchanged size and appearance of the indeterminate nodule within the left thyroid The procedure was planned. The neck was prepped in  the usual sterile fashion, and a sterile drape was applied covering the operative field. A timeout was performed prior to the initiation of the procedure. Local anesthesia was provided with 1% lidocaine. Under direct ultrasound guidance, 5 FNA biopsies were performed of the left inferior thyroid nodule with a 27 gauge needle. 2 of these samples were obtained for [facility] Multiple ultrasound images were saved for procedural documentation purposes. The samples were prepared and submitted to pathology. Limited post procedural scanning was negative for hematoma or additional complication. Dressings were placed. The patient tolerated the above procedures procedure well without immediate postprocedural complication. FINDINGS: Nodule reference number based on prior diagnostic ultrasound: 5 Maximum size: 2.0 cm Location: Left; Inferior ACR TI-RADS risk category: TR4 (4-6 points) Reason for biopsy: meets ACR TI-RADS criteria Ultrasound imaging confirms appropriate placement of the needles within the thyroid nodule. IMPRESSION: Technically successful ultrasound guided fine needle aspiration of left  inferior thyroid nodule Read by Pamela A Turpin PAC Electronically Signed   By: Jaime  Wagner D.O.   On: 01/20/2021 16:25   US BREAST LTD UNI LEFT INC AXILLA  Result Date: 02/02/2021 CLINICAL DATA:  Patient returns today to evaluate a possible LEFT breast mass questioned on recent screening mammogram. EXAM: DIGITAL DIAGNOSTIC UNILATERAL LEFT MAMMOGRAM WITH TOMOSYNTHESIS AND CAD; ULTRASOUND LEFT BREAST LIMITED TECHNIQUE: Left digital diagnostic mammography and breast tomosynthesis was performed. The images were evaluated with computer-aided detection.; Targeted ultrasound examination of the left breast was performed. COMPARISON:  Previous exams including recent screening mammogram dated 01/13/2021. ACR Breast Density Category b: There are scattered areas of fibroglandular density. FINDINGS: On today's additional diagnostic views, a  partially obscured mass is confirmed within the upper-outer quadrant of the LEFT breast, 1-2 o'clock axis region, measuring approximately 6 mm greatest dimension. Targeted ultrasound is performed, showing an irregular mass within the LEFT breast at the 2 o'clock axis, 6 cm from the nipple, measuring 9 x 4 x 6 mm, corresponding to the mammographic finding. LEFT axilla was evaluated with ultrasound showing no enlarged or morphologically abnormal lymph nodes. IMPRESSION: Irregular mass within the LEFT breast at the 2 o'clock axis, 6 cm from the nipple, measuring 9 mm, corresponding to the mammographic finding. This is a suspicious finding for which ultrasound-guided biopsy is recommended. RECOMMENDATION: Ultrasound-guided biopsy for the LEFT breast mass at the 2 o'clock axis, 6 cm from the nipple, measuring 9 mm. Ultrasound-guided biopsy is scheduled for September 13. I have discussed the findings and recommendations with the patient. If applicable, a reminder letter will be sent to the patient regarding the next appointment. BI-RADS CATEGORY  4: Suspicious. Electronically Signed   By: Stan  Maynard M.D.   On: 02/02/2021 16:26  MM DIAG BREAST TOMO UNI LEFT  Result Date: 02/02/2021 CLINICAL DATA:  Patient returns today to evaluate a possible LEFT breast mass questioned on recent screening mammogram. EXAM: DIGITAL DIAGNOSTIC UNILATERAL LEFT MAMMOGRAM WITH TOMOSYNTHESIS AND CAD; ULTRASOUND LEFT BREAST LIMITED TECHNIQUE: Left digital diagnostic mammography and breast tomosynthesis was performed. The images were evaluated with computer-aided detection.; Targeted ultrasound examination of the left breast was performed. COMPARISON:  Previous exams including recent screening mammogram dated 01/13/2021. ACR Breast Density Category b: There are scattered areas of fibroglandular density. FINDINGS: On today's additional diagnostic views, a partially obscured mass is confirmed within the upper-outer quadrant of the LEFT breast,  1-2 o'clock axis region, measuring approximately 6 mm greatest dimension. Targeted ultrasound is performed, showing an irregular mass within the LEFT breast at the 2 o'clock axis, 6 cm from the nipple, measuring 9 x 4 x 6 mm, corresponding to the mammographic finding. LEFT axilla was evaluated with ultrasound showing no enlarged or morphologically abnormal lymph nodes. IMPRESSION: Irregular mass within the LEFT breast at the 2 o'clock axis, 6 cm from the nipple, measuring 9 mm, corresponding to the mammographic finding. This is a suspicious finding for which ultrasound-guided biopsy is recommended. RECOMMENDATION: Ultrasound-guided biopsy for the LEFT breast mass at the 2 o'clock axis, 6 cm from the nipple, measuring 9 mm. Ultrasound-guided biopsy is scheduled for September 13. I have discussed the findings and recommendations with the patient. If applicable, a reminder letter will be sent to the patient regarding the next appointment. BI-RADS CATEGORY  4: Suspicious. Electronically Signed   By: Stan  Maynard M.D.   On: 02/02/2021 16:26  MM CLIP PLACEMENT LEFT  Result Date: 02/08/2021 CLINICAL DATA:  Evaluate RIBBON biopsy clip placement following ultrasound-guided   LEFT breast biopsy. EXAM: 3D DIAGNOSTIC LEFT MAMMOGRAM POST ULTRASOUND BIOPSY COMPARISON:  Previous exam(s). FINDINGS: 3D Mammographic images were obtained following ultrasound guided biopsy of the 0.9 cm mass at the 2 o'clock position of the LEFT breast. The RIBBON biopsy marking clip is in expected position at the site of biopsy. IMPRESSION: Appropriate positioning of the RIBBON shaped biopsy marking clip at the site of biopsy in the UPPER-OUTER LEFT breast. Final Assessment: Post Procedure Mammograms for Marker Placement Electronically Signed   By: Jeffrey  Hu M.D.   On: 02/08/2021 09:13  US LT BREAST BX W LOC DEV 1ST LESION IMG BX SPEC US GUIDE  Addendum Date: 02/09/2021   ADDENDUM REPORT: 02/09/2021 11:40 ADDENDUM: Pathology revealed GRADE II  INVASIVE DUCTAL CARCINOMA WITH EXTRACELLULAR MUCIN of the left BREAST, upper outer, 2:00 o'clock. This was found to be concordant by Dr. Jeff Hu. Pathology results were discussed with the patient by telephone. The patient reported doing well after the biopsy with tenderness at the site. Post biopsy instructions and care were reviewed and questions were answered. The patient was encouraged to call [facility] for any additional concerns. The patient was referred to [facility] at [facility] on February 16, 2021. Pathology results reported by Susan Eaton RN on 02/09/2021. Electronically Signed   By: Jeffrey  Hu M.D.   On: 02/09/2021 11:40   Result Date: 02/09/2021 CLINICAL DATA:  64-year-old female for tissue sampling of 0.9 cm UPPER-OUTER LEFT breast mass. EXAM: ULTRASOUND GUIDED LEFT BREAST CORE NEEDLE BIOPSY COMPARISON:  Previous exam(s). FINDINGS: I met with the patient and we discussed the procedure of ultrasound-guided biopsy, including benefits and alternatives. We discussed the high likelihood of a successful procedure. We discussed the risks of the procedure, including infection, bleeding, tissue injury, clip migration, and inadequate sampling. Informed written consent was given. The usual time-out protocol was performed immediately prior to the procedure. Lesion quadrant: UPPER-OUTER LEFT breast Using sterile technique and 1% Lidocaine as local anesthetic, under direct ultrasound visualization, a 12 gauge spring-loaded device was used to perform biopsy of the 0.9 cm mass at the 2 o'clock position of the LEFT breast 6 cm from the nipple using a MEDIAL approach. At the conclusion of the procedure a RIBBON tissue marker clip was deployed into the biopsy cavity. Follow up 2 view mammogram was performed and dictated separately. IMPRESSION: Ultrasound guided biopsy of 0.9 cm UPPER-OUTER LEFT breast mass. No apparent  complications. Electronically Signed: By: Jeffrey  Hu M.D. On: 02/08/2021 09:11      IMPRESSION/PLAN: 1. Stage IA, [id number] grade 2, ER/PR positive invasive ductal carcinoma of the left breast. Dr. Moody discusses the pathology findings and reviews the nature of left breast disease. The consensus from the breast conference includes breast conservation with lumpectomy with  sentinel node biopsy. Dr. Magrinat anticipates Oncotype Dx score to determine a role for systemic therapy. Provided that chemotherapy is not indicated, the patient's course would then be followed by external radiotherapy to the breast  to reduce risks of local recurrence followed by antiestrogen therapy. We discussed the risks, benefits, short, and long term effects of radiotherapy, as well as the curative intent, and the patient is interested in proceeding. Dr. Moody discusses the delivery and logistics of radiotherapy and anticipates a course of 4 or up to 6 1/2 weeks of radiotherapy to the left breast with deep inspiration breath hold technique. We will see her back a few weeks after surgery   to discuss the simulation process and anticipate we starting radiotherapy about 4-6 weeks after surgery.    In a visit lasting 60 minutes, greater than 50% of the time was spent face to face reviewing her case, as well as in preparation of, discussing, and coordinating the patient's care.  The above documentation reflects my direct findings during this shared patient visit. Please see the separate note by Dr. Moody on this date for the remainder of the patient's plan of care.    Alison C. Perkins, PAC    **Disclaimer: This note was dictated with voice recognition software. Similar sounding words can inadvertently be transcribed and this note may contain transcription errors which may not have been corrected upon publication of note.** 

## 2021-02-16 NOTE — Therapy (Signed)
[place], [place], [address] Phone: [phone]   Fax:  [phone]  Physical Therapy Evaluation  Patient Details  Name: Teresa Knapp MRN: [id number] Date of Birth: 27-Feb-1956 Referring Provider (PT): Dr. Erroll Luna   Encounter Date: 02/16/2021   PT End of Session - 02/16/21 1133     Visit Number 1    Number of Visits 2    Date for PT Re-Evaluation 04/13/21    PT Start Time 1013    PT Stop Time 1033   Also saw pt from 1105-1112 for a total of 27 minutes   PT Time Calculation (min) 20 min    Activity Tolerance Patient tolerated treatment well    Behavior During Therapy [facility] for tasks assessed/performed             Past Medical History:  Diagnosis Date   Family history of adverse reaction to anesthesia    mother had a possible reaction to anesthesia (possible jerking) not sure    Past Surgical History:  Procedure Laterality Date   [place] and 1992   CHOLECYSTECTOMY N/A 03/03/2019   Procedure: LAPAROSCOPIC CHOLECYSTECTOMY WITH INTRAOPERATIVE CHOLANGIOGRAM;  Surgeon: Armandina Gemma, MD;  Location: [facility];  Service: General;  Laterality: N/A;   lipoma removal     right side of neck   UMBILICAL HERNIA REPAIR N/A 03/03/2019   Procedure: PRIMARY REPAIR OF UMBILICAL HERNIA;  Surgeon: Armandina Gemma, MD;  Location: [facility];  Service: General;  Laterality: N/A;    There were no vitals filed for this visit.    Subjective Assessment - 02/16/21 1059     Subjective Patient reports she is here today to be seen by her medical team for her newly diagnosed left breast cancer.    Patient is accompained by: Family member    Pertinent History Patient was diagnosed on 01/19/2021 with left grade II invasive ductal carcinoma breast cancer. It measures 1.1 cm and is located in the upper outer quadrant. It is ER/PR positive and HER2 negative with a Ki67 of 30%. She had a right total  knee replacement in 2018 and a partial left knee replacement in 2008.    Patient Stated Goals Reduce lymphedema risk and learn post op HEP    Currently in Pain? No/denies                [facility] PT Assessment - 02/16/21 0001       Assessment   Medical Diagnosis Left breast cancer    Referring Provider (PT) Dr. Marcello Moores [place]    Onset Date/Surgical Date 01/19/21    Hand Dominance Right    Prior Therapy None      Precautions   Precautions Other (comment)    Precaution Comments active cancer      Restrictions   Weight Bearing Restrictions No      Balance Screen   Has the patient fallen in the past 6 months No    Has the patient had a decrease in activity level because of a fear of falling?  No    Is the patient reluctant to leave their home because of a fear of falling?  No      Home Social worker Private residence    Living Arrangements Alone    Available Help at Discharge Family      Prior Function   Level of [place] Retired    Leisure She  is active but does not exercise      Cognition   Overall Cognitive Status Within Functional Limits for tasks assessed      Posture/Postural Control   Posture/Postural Control Postural limitations    Postural Limitations Rounded Shoulders;Forward head      ROM / Strength   AROM / PROM / Strength AROM;Strength      AROM   AROM Assessment Site Shoulder    Right/Left Shoulder Right;Left    Right Shoulder Extension 39 Degrees    Right Shoulder Flexion 156 Degrees    Right Shoulder ABduction 152 Degrees    Right Shoulder Internal Rotation 71 Degrees    Right Shoulder External Rotation 78 Degrees    Left Shoulder Extension 59 Degrees    Left Shoulder Flexion 146 Degrees    Left Shoulder ABduction 155 Degrees    Left Shoulder Internal Rotation 82 Degrees    Left Shoulder External Rotation 70 Degrees      Strength   Overall Strength Within functional limits for tasks performed                LYMPHEDEMA/ONCOLOGY QUESTIONNAIRE - 02/16/21 0001       Type   Cancer Type Left breast cancer      Lymphedema Assessments   Lymphedema Assessments Upper extremities      Right Upper Extremity Lymphedema   10 cm Proximal to Olecranon Process 33.5 cm    Olecranon Process 28.7 cm    10 cm Proximal to Ulnar Styloid Process 23.9 cm    Just Proximal to Ulnar Styloid Process 17.4 cm    Across Hand at [employer] 20.6 cm    At [place] of 2nd Digit 6.9 cm      Left Upper Extremity Lymphedema   10 cm Proximal to Olecranon Process 32.6 cm    Olecranon Process 27 cm    10 cm Proximal to Ulnar Styloid Process 25.3 cm    Just Proximal to Ulnar Styloid Process 17 cm    Across Hand at [employer] 20 cm    At [place] of 2nd Digit 6.8 cm             L-DEX FLOWSHEETS - 02/16/21 1100       L-DEX LYMPHEDEMA SCREENING   Measurement Type Unilateral    L-DEX MEASUREMENT EXTREMITY Upper Extremity    POSITION  Standing    DOMINANT SIDE Right    At Risk Side Left    BASELINE SCORE (UNILATERAL) 2.5             The patient was assessed using the L-Dex machine today to produce a lymphedema index baseline score. The patient will be reassessed on a regular basis (typically every 3 months) to obtain new L-Dex scores. If the score is > 6.5 points away from his/her baseline score indicating onset of subclinical lymphedema, it will be recommended to wear a compression garment for 4 weeks, 12 hours per day and then be reassessed. If the score continues to be > 6.5 points from baseline at reassessment, we will initiate lymphedema treatment. Assessing in this manner has a 95% rate of preventing clinically significant lymphedema.      Katina Dung - 02/16/21 0001     Open a tight or new jar No difficulty    Do heavy household chores (wash walls, wash floors) No difficulty    Carry a shopping bag or briefcase No difficulty    Wash your back No difficulty  Use a knife to cut  food No difficulty    Recreational activities in which you take some force or impact through your arm, shoulder, or hand (golf, hammering, tennis) No difficulty    During the past week, to what extent has your arm, shoulder or hand problem interfered with your normal social activities with family, friends, neighbors, or groups? Not at all    During the past week, to what extent has your arm, shoulder or hand problem limited your work or other regular daily activities Not at all    Arm, shoulder, or hand pain. None    Tingling (pins and needles) in your arm, shoulder, or hand None    Difficulty Sleeping No difficulty    DASH Score 0 %              Objective measurements completed on examination: See above findings.       Patient was instructed today in a home exercise program today for post op shoulder range of motion. These included active assist shoulder flexion in sitting, scapular retraction, wall walking with shoulder abduction, and hands behind head external rotation.  She was encouraged to do these twice a day, holding 3 seconds and repeating 5 times when permitted by her physician.           PT Education - 02/16/21 1132     Education Details Lymphedema risk reduction and post op shoulder HEP    Person(s) Educated Patient;Other (comment)   friend   Methods Explanation;Demonstration;Handout    Comprehension Verbalized understanding;Returned demonstration                 PT Long Term Goals - 02/16/21 1135       PT LONG TERM GOAL #1   Title Patient will demonstrate she has regained full shoulder ROM and function post operatively compared to baselines.    Time 8    Period Weeks    Status New    Target Date 04/13/21             Breast Clinic Goals - 02/16/21 1135       Patient will be able to verbalize understanding of pertinent lymphedema risk reduction practices relevant to her diagnosis specifically related to skin care.   Time 1    Period Days     Status Achieved      Patient will be able to return demonstrate and/or verbalize understanding of the post-op home exercise program related to regaining shoulder range of motion.   Time 1    Period Days    Status Achieved      Patient will be able to verbalize understanding of the importance of attending the postoperative After Breast Cancer Class for further lymphedema risk reduction education and therapeutic exercise.   Time 1    Period Days    Status Achieved                   Plan - 02/16/21 1133     Clinical Impression Statement Patient was diagnosed on 01/19/2021 with left grade II invasive ductal carcinoma breast cancer. It measures 1.1 cm and is located in the upper outer quadrant. It is ER/PR positive and HER2 negative with a Ki67 of 30%. She had a right total knee replacement in 2018 and a partial left knee replacement in 2008. Her multidisciplinary medical team met prior to her assessments to determine a recommended treatment plan. She is planning to have a left lumpectomy and sentinel node  biopsy followed by Oncotype testing, radiation, and anti-estrogen therapy. She will benefit from a post op PT reassessment to determine needs and from L-Dex screens every 3 months for 2 years to detect subclinical lymphedema.    Stability/Clinical Decision Making Stable/Uncomplicated    Clinical Decision Making Low    Rehab Potential Excellent    PT Frequency --   Eval and 1 f/u visit   PT Treatment/Interventions ADLs/Self Care Home Management;Therapeutic exercise;Patient/family education    PT Next Visit Plan Will reassess 3-4 weeks post op    PT Home Exercise Plan Post op shoulder ROM HEP    Consulted and Agree with Plan of Care Patient;Other (Comment)   friend            Patient will benefit from skilled therapeutic intervention in order to improve the following deficits and impairments:  Postural dysfunction, Decreased range of motion, Decreased knowledge of precautions,  Impaired UE functional use, Pain  Visit Diagnosis: Malignant neoplasm of upper-outer quadrant of left breast in female, estrogen receptor positive ([place]) - Plan: PT plan of care cert/re-cert  Abnormal posture - Plan: PT plan of care cert/re-cert  Patient will follow up at outpatient cancer rehab 3-4 weeks following surgery.  If the patient requires physical therapy at that time, a specific plan will be dictated and sent to the referring physician for approval. The patient was educated today on appropriate basic range of motion exercises to begin post operatively and the importance of attending the After Breast Cancer class following surgery.  Patient was educated today on lymphedema risk reduction practices as it pertains to recommendations that will benefit the patient immediately following surgery.  She verbalized good understanding.      Problem List Patient Active Problem List   Diagnosis Date Noted   Malignant neoplasm of upper-outer quadrant of left breast in female, estrogen receptor positive ([place]) 02/14/2021   Cholelithiasis with chronic cholecystitis 03/02/2019   Annia Friendly, PT 02/16/21 11:39 AM   [place], [place], [address] Phone: [phone]   Fax:  [phone]  Name: Shevelle Elowyn Raupp MRN: [id number] Date of Birth: 1956/04/17

## 2021-02-17 ENCOUNTER — Other Ambulatory Visit: Payer: Self-pay | Admitting: Surgery

## 2021-02-17 DIAGNOSIS — Z170 Estrogen receptor positive status [ER+]: Secondary | ICD-10-CM

## 2021-02-17 DIAGNOSIS — M25562 Pain in left knee: Secondary | ICD-10-CM | POA: Diagnosis not present

## 2021-02-17 DIAGNOSIS — C50412 Malignant neoplasm of upper-outer quadrant of left female breast: Secondary | ICD-10-CM

## 2021-02-17 DIAGNOSIS — M25561 Pain in right knee: Secondary | ICD-10-CM | POA: Diagnosis not present

## 2021-02-22 ENCOUNTER — Telehealth: Payer: Self-pay | Admitting: *Deleted

## 2021-02-22 ENCOUNTER — Encounter: Payer: Self-pay | Admitting: *Deleted

## 2021-02-22 ENCOUNTER — Other Ambulatory Visit: Payer: Self-pay | Admitting: Sports Medicine

## 2021-02-22 ENCOUNTER — Ambulatory Visit
Admission: RE | Admit: 2021-02-22 | Discharge: 2021-02-22 | Disposition: A | Discharge status: Home / Self Care | Payer: Medicare HMO | Source: Ambulatory Visit | Attending: Sports Medicine | Admitting: Sports Medicine

## 2021-02-22 DIAGNOSIS — M25561 Pain in right knee: Secondary | ICD-10-CM

## 2021-02-22 DIAGNOSIS — M25562 Pain in left knee: Secondary | ICD-10-CM

## 2021-02-22 DIAGNOSIS — M170 Bilateral primary osteoarthritis of knee: Secondary | ICD-10-CM | POA: Diagnosis not present

## 2021-02-22 NOTE — Telephone Encounter (Signed)
Spoke with patient to follow up from [facility] 9/21 and assess navigation needs.  Answered some questions she had regarding appts.  Encouraged her to call should anything else arise.  Patient verbalized understanding.

## 2021-02-28 NOTE — Progress Notes (Signed)
Surgical Instructions    Your procedure is scheduled on Thursday, October 13th, 2022.   Report to [facility] Main Entrance "A" at 10:30 A.M., then check in with the Admitting office.  Call this number if you have problems the morning of surgery:  [phone]   If you have any questions prior to your surgery date call [phone]: Open Monday-Friday 8am-4pm    Remember:  Do not eat after midnight the night before your surgery  You may drink clear liquids until 09:30 the morning of your surgery.   Clear liquids allowed are: Water, Non-Citrus Juices (without pulp), Carbonated Beverages, Clear Tea, Black Coffee ONLY (NO MILK, CREAM OR POWDERED CREAMER of any kind), and Gatorade    Take these medicines the morning of surgery with A SIP OF WATER:  rosuvastatin (CRESTOR)    As of today, STOP taking any Aspirin (unless otherwise instructed by your surgeon) Aleve, Naproxen, Ibuprofen, Motrin, Advil, Goody's, BC's, all herbal medications, fish oil, and all vitamins.   After your COVID test   You are not required to quarantine however you are required to wear a well-fitting mask when you are out and around people not in your household.  If your mask becomes wet or soiled, replace with a new one.  Wash your hands often with soap and water for 20 seconds or clean your hands with an alcohol-based hand sanitizer that contains at least 60% alcohol.  Do not share personal items.  Notify your provider: if you are in close contact with someone who has COVID  or if you develop a fever of 100.4 or greater, sneezing, cough, sore throat, shortness of breath or body aches.    The day of surgery:          Do not wear jewelry or makeup Do not wear lotions, powders, perfumes, or deodorant. Do not shave 48 hours prior to surgery.  Do not bring valuables to the hospital. DO Not wear nail polish, gel polish, artificial nails, or any other type of covering on natural nails including finger and  toenails. If patients have artificial nails, gel coating, etc. that need to be removed by a nail salon please have this removed prior to surgery or surgery may need to be canceled/delayed if the surgeon/ anesthesia feels like the patient is unable to be adequately monitored.              [facility] is not responsible for any belongings or valuables.  Do NOT Smoke (Tobacco/Vaping)  24 hours prior to your procedure If you use a CPAP at night, you may bring your mask for your overnight stay.   Contacts, glasses, dentures or bridgework may not be worn into surgery, please bring cases for these belongings   For patients admitted to the hospital, discharge time will be determined by your treatment team.   Patients discharged the day of surgery will not be allowed to drive home, and someone needs to stay with them for 24 hours.  NO VISITORS WILL BE ALLOWED IN PRE-OP WHERE PATIENTS ARE PREPPED FOR SURGERY.  ONLY 1 SUPPORT PERSON MAY BE PRESENT IN THE WAITING ROOM WHILE YOU ARE IN SURGERY.  IF YOU ARE TO BE ADMITTED, ONCE YOU ARE IN YOUR ROOM YOU WILL BE ALLOWED TWO (2) VISITORS. 1 (ONE) VISITOR MAY STAY OVERNIGHT BUT MUST ARRIVE TO THE ROOM BY 8pm.  Minor children may have two parents present. Special consideration for safety and communication needs will be reviewed on a case by case  basis.  Special instructions:    Oral Hygiene is also important to reduce your risk of infection.  Remember - BRUSH YOUR TEETH THE MORNING OF SURGERY WITH YOUR REGULAR TOOTHPASTE   [facility]- Preparing For Surgery  Before surgery, you can play an important role. Because skin is not sterile, your skin needs to be as free of germs as possible. You can reduce the number of germs on your skin by washing with CHG (chlorahexidine gluconate) Soap before surgery.  CHG is an antiseptic cleaner which kills germs and bonds with the skin to continue killing germs even after washing.     Please do not use if you have an allergy  to CHG or antibacterial soaps. If your skin becomes reddened/irritated stop using the CHG.  Do not shave (including legs and underarms) for at least 48 hours prior to first CHG shower. It is OK to shave your face.  Please follow these instructions carefully.     Shower the NIGHT BEFORE SURGERY and the MORNING OF SURGERY with CHG Soap.   If you chose to wash your hair, wash your hair first as usual with your normal shampoo. After you shampoo, rinse your hair and body thoroughly to remove the shampoo.  Then [employer] and genitals (private parts) with your normal soap and rinse thoroughly to remove soap.  After that Use CHG Soap as you would any other liquid soap. You can apply CHG directly to the skin and wash gently with a scrungie or a clean washcloth.   Apply the CHG Soap to your body ONLY FROM THE NECK DOWN.  Do not use on open wounds or open sores. Avoid contact with your eyes, ears, mouth and genitals (private parts). Wash Face and genitals (private parts)  with your normal soap.   Wash thoroughly, paying special attention to the area where your surgery will be performed.  Thoroughly rinse your body with warm water from the neck down.  DO NOT shower/wash with your normal soap after using and rinsing off the CHG Soap.  Pat yourself dry with a CLEAN TOWEL.  Wear CLEAN PAJAMAS to bed the night before surgery  Place CLEAN SHEETS on your bed the night before your surgery  DO NOT SLEEP WITH PETS.   Day of Surgery:  Take a shower with CHG soap. Wear Clean/Comfortable clothing the morning of surgery Do not apply any deodorants/lotions.   Remember to brush your teeth WITH YOUR REGULAR TOOTHPASTE.   Please read over the following fact sheets that you were given.

## 2021-03-01 ENCOUNTER — Encounter (HOSPITAL_COMMUNITY): Payer: Self-pay

## 2021-03-01 ENCOUNTER — Encounter (HOSPITAL_COMMUNITY)
Admission: RE | Admit: 2021-03-01 | Discharge: 2021-03-01 | Disposition: A | Discharge status: Home / Self Care | Payer: Medicare HMO | Source: Ambulatory Visit | Attending: Surgery | Admitting: Surgery

## 2021-03-01 ENCOUNTER — Other Ambulatory Visit: Payer: Self-pay

## 2021-03-01 DIAGNOSIS — Z01812 Encounter for preprocedural laboratory examination: Secondary | ICD-10-CM | POA: Diagnosis not present

## 2021-03-01 DIAGNOSIS — C50412 Malignant neoplasm of upper-outer quadrant of left female breast: Secondary | ICD-10-CM | POA: Diagnosis not present

## 2021-03-01 DIAGNOSIS — Z170 Estrogen receptor positive status [ER+]: Secondary | ICD-10-CM | POA: Insufficient documentation

## 2021-03-01 HISTORY — DX: Unspecified osteoarthritis, unspecified site: M19.90

## 2021-03-01 LAB — CBC WITH DIFFERENTIAL/PLATELET
Abs Immature Granulocytes: 0.05 10*3/uL (ref 0.00–0.07)
Basophils Absolute: 0.1 10*3/uL (ref 0.0–0.1)
Basophils Relative: 1 %
Eosinophils Absolute: 0.1 10*3/uL (ref 0.0–0.5)
Eosinophils Relative: 1 %
HCT: 39.3 % (ref 36.0–46.0)
Hemoglobin: 12.9 g/dL (ref 12.0–15.0)
Immature Granulocytes: 1 %
Lymphocytes Relative: 26 %
Lymphs Abs: 2.5 10*3/uL (ref 0.7–4.0)
MCH: 30.4 pg (ref 26.0–34.0)
MCHC: 32.8 g/dL (ref 30.0–36.0)
MCV: 92.7 fL (ref 80.0–100.0)
Monocytes Absolute: 0.6 10*3/uL (ref 0.1–1.0)
Monocytes Relative: 6 %
Neutro Abs: 6.2 10*3/uL (ref 1.7–7.7)
Neutrophils Relative %: 65 %
Platelets: 249 10*3/uL (ref 150–400)
RBC: 4.24 MIL/uL (ref 3.87–5.11)
RDW: 12.5 % (ref 11.5–15.5)
WBC: 9.4 10*3/uL (ref 4.0–10.5)
nRBC: 0 % (ref 0.0–0.2)

## 2021-03-01 LAB — COMPREHENSIVE METABOLIC PANEL
ALT: 17 U/L (ref 0–44)
AST: 18 U/L (ref 15–41)
Albumin: 3.6 g/dL (ref 3.5–5.0)
Alkaline Phosphatase: 67 U/L (ref 38–126)
Anion gap: 8 (ref 5–15)
BUN: 12 mg/dL (ref 8–23)
CO2: 25 mmol/L (ref 22–32)
Calcium: 9.3 mg/dL (ref 8.9–10.3)
Chloride: 105 mmol/L (ref 98–111)
Creatinine, Ser: 0.79 mg/dL (ref 0.44–1.00)
GFR, Estimated: >60 mL/min (ref >60)
Glucose, Bld: 103 mg/dL — ABNORMAL HIGH (ref 70–99)
Potassium: 4 mmol/L (ref 3.5–5.1)
Sodium: 138 mmol/L (ref 135–145)
Total Bilirubin: 0.8 mg/dL (ref 0.3–1.2)
Total Protein: 6.8 g/dL (ref 6.5–8.1)

## 2021-03-01 NOTE — Progress Notes (Signed)
PCP - Kathyrn Lass, MD Cardiologist - denies  PPM/ICD - denies Device Orders - n/a Rep Notified - n/a  Chest x-ray - n/a EKG - n/a Stress Test - denies ECHO - denies Cardiac Cath - denies  Sleep Study - denies CPAP - n/a  Fasting Blood Sugar - n/a  Blood Thinner Instructions: n/a  Aspirin Instructions: Patient was instructed: As of today, STOP taking any Aspirin (unless otherwise instructed by your surgeon) Aleve, Naproxen, Ibuprofen, Motrin, Advil, Goody's, BC's, all herbal medications, fish oil, and all vitamins.  ERAS Protcol - yes PRE-SURGERY Ensure or G2- no  COVID TEST- no, ambulatory surgery  Anesthesia review: no  Patient denies shortness of breath, fever, cough and chest pain at PAT appointment   All instructions explained to the patient, with a verbal understanding of the material. Patient agrees to go over the instructions while at home for a better understanding. Patient also instructed to self quarantine after being tested for COVID-19. The opportunity to ask questions was provided.

## 2021-03-09 ENCOUNTER — Other Ambulatory Visit: Payer: Self-pay

## 2021-03-09 ENCOUNTER — Ambulatory Visit
Admission: RE | Admit: 2021-03-09 | Discharge: 2021-03-09 | Disposition: A | Discharge status: Home / Self Care | Payer: Medicare HMO | Source: Ambulatory Visit | Attending: Surgery | Admitting: Surgery

## 2021-03-09 DIAGNOSIS — C50912 Malignant neoplasm of unspecified site of left female breast: Secondary | ICD-10-CM | POA: Diagnosis not present

## 2021-03-09 DIAGNOSIS — C50412 Malignant neoplasm of upper-outer quadrant of left female breast: Secondary | ICD-10-CM

## 2021-03-09 DIAGNOSIS — Z170 Estrogen receptor positive status [ER+]: Secondary | ICD-10-CM

## 2021-03-09 HISTORY — PX: BREAST LUMPECTOMY: SHX2

## 2021-03-10 ENCOUNTER — Other Ambulatory Visit: Payer: Self-pay

## 2021-03-10 ENCOUNTER — Ambulatory Visit (HOSPITAL_COMMUNITY): Payer: Medicare HMO | Admitting: Anesthesiology

## 2021-03-10 ENCOUNTER — Encounter (HOSPITAL_COMMUNITY)
Admission: RE | Disposition: A | Discharge status: Home / Self Care | Payer: Self-pay | Source: Home / Self Care | Attending: Surgery

## 2021-03-10 ENCOUNTER — Ambulatory Visit (HOSPITAL_COMMUNITY)
Admission: RE | Admit: 2021-03-10 | Discharge: 2021-03-10 | Disposition: A | Discharge status: Home / Self Care | Payer: Medicare HMO | Attending: Surgery | Admitting: Surgery

## 2021-03-10 ENCOUNTER — Encounter (HOSPITAL_COMMUNITY): Payer: Self-pay | Admitting: Surgery

## 2021-03-10 ENCOUNTER — Ambulatory Visit
Admission: RE | Admit: 2021-03-10 | Discharge: 2021-03-10 | Disposition: A | Discharge status: Home / Self Care | Payer: Medicare HMO | Source: Ambulatory Visit | Attending: Surgery | Admitting: Surgery

## 2021-03-10 DIAGNOSIS — Z7984 Long term (current) use of oral hypoglycemic drugs: Secondary | ICD-10-CM | POA: Insufficient documentation

## 2021-03-10 DIAGNOSIS — C50412 Malignant neoplasm of upper-outer quadrant of left female breast: Secondary | ICD-10-CM | POA: Insufficient documentation

## 2021-03-10 DIAGNOSIS — Z79899 Other long term (current) drug therapy: Secondary | ICD-10-CM | POA: Insufficient documentation

## 2021-03-10 DIAGNOSIS — E785 Hyperlipidemia, unspecified: Secondary | ICD-10-CM | POA: Insufficient documentation

## 2021-03-10 DIAGNOSIS — Z803 Family history of malignant neoplasm of breast: Secondary | ICD-10-CM | POA: Diagnosis not present

## 2021-03-10 DIAGNOSIS — Z170 Estrogen receptor positive status [ER+]: Secondary | ICD-10-CM

## 2021-03-10 DIAGNOSIS — E079 Disorder of thyroid, unspecified: Secondary | ICD-10-CM | POA: Diagnosis not present

## 2021-03-10 DIAGNOSIS — N6082 Other benign mammary dysplasias of left breast: Secondary | ICD-10-CM | POA: Diagnosis not present

## 2021-03-10 DIAGNOSIS — N6012 Diffuse cystic mastopathy of left breast: Secondary | ICD-10-CM | POA: Diagnosis not present

## 2021-03-10 DIAGNOSIS — G8918 Other acute postprocedural pain: Secondary | ICD-10-CM | POA: Diagnosis not present

## 2021-03-10 DIAGNOSIS — N62 Hypertrophy of breast: Secondary | ICD-10-CM | POA: Diagnosis not present

## 2021-03-10 DIAGNOSIS — M1990 Unspecified osteoarthritis, unspecified site: Secondary | ICD-10-CM | POA: Diagnosis not present

## 2021-03-10 DIAGNOSIS — C50912 Malignant neoplasm of unspecified site of left female breast: Secondary | ICD-10-CM | POA: Diagnosis not present

## 2021-03-10 HISTORY — PX: BREAST LUMPECTOMY WITH RADIOACTIVE SEED AND SENTINEL LYMPH NODE BIOPSY: SHX6550

## 2021-03-10 SURGERY — BREAST LUMPECTOMY WITH RADIOACTIVE SEED AND SENTINEL LYMPH NODE BIOPSY
Anesthesia: Regional | Site: Breast | Laterality: Left

## 2021-03-10 MED ORDER — PHENYLEPHRINE 40 MCG/ML (10ML) SYRINGE FOR IV PUSH (FOR BLOOD PRESSURE SUPPORT)
PREFILLED_SYRINGE | INTRAVENOUS | Status: AC
  Filled 2021-03-10: qty 20

## 2021-03-10 MED ORDER — ACETAMINOPHEN 500 MG PO TABS
1000.0000 mg | ORAL_TABLET | Freq: Once | ORAL | Status: AC
  Administered 2021-03-10: 1000 mg via ORAL
  Filled 2021-03-10: qty 2

## 2021-03-10 MED ORDER — MIDAZOLAM HCL 2 MG/2ML IJ SOLN: 2.0000 mg | Freq: Once | INTRAMUSCULAR | Status: AC

## 2021-03-10 MED ORDER — CHLORHEXIDINE GLUCONATE CLOTH 2 % EX PADS: 6.0000 | MEDICATED_PAD | Freq: Once | CUTANEOUS | Status: DC

## 2021-03-10 MED ORDER — GLYCOPYRROLATE 0.2 MG/ML IJ SOLN
INTRAMUSCULAR | Status: DC | PRN
  Administered 2021-03-10: .2 mg via INTRAVENOUS

## 2021-03-10 MED ORDER — FENTANYL CITRATE (PF) 100 MCG/2ML IJ SOLN: 25.0000 ug | INTRAMUSCULAR | Status: DC | PRN

## 2021-03-10 MED ORDER — LIDOCAINE HCL (CARDIAC) PF 100 MG/5ML IV SOSY
PREFILLED_SYRINGE | INTRAVENOUS | Status: DC | PRN
  Administered 2021-03-10: 20 mg via INTRAVENOUS

## 2021-03-10 MED ORDER — MAGTRACE LYMPHATIC TRACER
INTRAMUSCULAR | Status: DC | PRN
  Administered 2021-03-10: 2 mL via INTRAMUSCULAR

## 2021-03-10 MED ORDER — FENTANYL CITRATE (PF) 100 MCG/2ML IJ SOLN: INTRAMUSCULAR | Status: DC | PRN

## 2021-03-10 MED ORDER — PHENYLEPHRINE 40 MCG/ML (10ML) SYRINGE FOR IV PUSH (FOR BLOOD PRESSURE SUPPORT)
PREFILLED_SYRINGE | INTRAVENOUS | Status: DC | PRN
  Administered 2021-03-10 (×4): 120 ug via INTRAVENOUS

## 2021-03-10 MED ORDER — ORAL CARE MOUTH RINSE: 15.0000 mL | Freq: Once | OROMUCOSAL | Status: AC

## 2021-03-10 MED ORDER — OXYCODONE HCL 5 MG PO TABS: 5.0000 mg | ORAL_TABLET | Freq: Four times a day (QID) | ORAL | 0 refills | Status: DC | PRN

## 2021-03-10 MED ORDER — MIDAZOLAM HCL 2 MG/2ML IJ SOLN: 1.0000 mg | Freq: Once | INTRAMUSCULAR | Status: DC

## 2021-03-10 MED ORDER — FENTANYL CITRATE (PF) 100 MCG/2ML IJ SOLN: 50.0000 ug | Freq: Once | INTRAMUSCULAR | Status: AC

## 2021-03-10 MED ORDER — FENTANYL CITRATE (PF) 250 MCG/5ML IJ SOLN
INTRAMUSCULAR | Status: AC
  Filled 2021-03-10: qty 5

## 2021-03-10 MED ORDER — DEXAMETHASONE SODIUM PHOSPHATE 10 MG/ML IJ SOLN
INTRAMUSCULAR | Status: DC | PRN
  Administered 2021-03-10: 10 mg
  Administered 2021-03-10: 5 mg

## 2021-03-10 MED ORDER — FENTANYL CITRATE (PF) 100 MCG/2ML IJ SOLN
INTRAMUSCULAR | Status: AC
  Administered 2021-03-10: 50 ug via INTRAVENOUS
  Filled 2021-03-10: qty 2

## 2021-03-10 MED ORDER — PROPOFOL 10 MG/ML IV BOLUS
INTRAVENOUS | Status: AC
  Filled 2021-03-10: qty 20

## 2021-03-10 MED ORDER — DEXAMETHASONE SODIUM PHOSPHATE 10 MG/ML IJ SOLN
INTRAMUSCULAR | Status: AC
  Filled 2021-03-10: qty 1

## 2021-03-10 MED ORDER — ONDANSETRON HCL 4 MG/2ML IJ SOLN
INTRAMUSCULAR | Status: DC | PRN
  Administered 2021-03-10: 4 mg via INTRAVENOUS

## 2021-03-10 MED ORDER — LIDOCAINE 2% (20 MG/ML) 5 ML SYRINGE
INTRAMUSCULAR | Status: AC
  Filled 2021-03-10: qty 5

## 2021-03-10 MED ORDER — MIDAZOLAM HCL 2 MG/2ML IJ SOLN
INTRAMUSCULAR | Status: AC
  Administered 2021-03-10: 2 mg via INTRAVENOUS
  Filled 2021-03-10: qty 2

## 2021-03-10 MED ORDER — CHLORHEXIDINE GLUCONATE 0.12 % MT SOLN
15.0000 mL | Freq: Once | OROMUCOSAL | Status: AC
  Administered 2021-03-10: 15 mL via OROMUCOSAL
  Filled 2021-03-10: qty 15

## 2021-03-10 MED ORDER — LACTATED RINGERS IV SOLN: INTRAVENOUS | Status: DC

## 2021-03-10 MED ORDER — ROPIVACAINE HCL 5 MG/ML IJ SOLN
INTRAMUSCULAR | Status: DC | PRN
  Administered 2021-03-10: 30 mL via PERINEURAL

## 2021-03-10 MED ORDER — BUPIVACAINE-EPINEPHRINE (PF) 0.25% -1:200000 IJ SOLN
INTRAMUSCULAR | Status: AC
  Filled 2021-03-10: qty 30

## 2021-03-10 MED ORDER — FENTANYL CITRATE (PF) 100 MCG/2ML IJ SOLN
INTRAMUSCULAR | Status: DC | PRN
  Administered 2021-03-10: 50 ug via INTRAVENOUS
  Administered 2021-03-10: 100 ug via INTRAVENOUS

## 2021-03-10 MED ORDER — IBUPROFEN 800 MG PO TABS: 800.0000 mg | ORAL_TABLET | Freq: Three times a day (TID) | ORAL | 0 refills | Status: DC | PRN

## 2021-03-10 MED ORDER — HEMOSTATIC AGENTS (NO CHARGE) OPTIME
TOPICAL | Status: DC | PRN
  Administered 2021-03-10: 1 via TOPICAL

## 2021-03-10 MED ORDER — 0.9 % SODIUM CHLORIDE (POUR BTL) OPTIME
TOPICAL | Status: DC | PRN
  Administered 2021-03-10: 1000 mL

## 2021-03-10 MED ORDER — MIDAZOLAM HCL 2 MG/2ML IJ SOLN
INTRAMUSCULAR | Status: AC
  Filled 2021-03-10: qty 2

## 2021-03-10 MED ORDER — ONDANSETRON HCL 4 MG/2ML IJ SOLN
INTRAMUSCULAR | Status: AC
  Filled 2021-03-10: qty 2

## 2021-03-10 MED ORDER — CEFAZOLIN SODIUM-DEXTROSE 2-4 GM/100ML-% IV SOLN
2.0000 g | INTRAVENOUS | Status: DC
  Filled 2021-03-10: qty 100

## 2021-03-10 MED ORDER — PROPOFOL 10 MG/ML IV BOLUS
INTRAVENOUS | Status: DC | PRN
  Administered 2021-03-10: 150 mg via INTRAVENOUS

## 2021-03-10 SURGICAL SUPPLY — 54 items
ADH SKN CLS APL DERMABOND .7 (GAUZE/BANDAGES/DRESSINGS) ×2
APL PRP STRL LF DISP 70% ISPRP (MISCELLANEOUS) ×1
APPLIER CLIP 9.375 MED OPEN (MISCELLANEOUS)
APR CLP MED 9.3 20 MLT OPN (MISCELLANEOUS)
BAG COUNTER SPONGE SURGICOUNT (BAG) ×1 IMPLANT
BAG SPNG CNTER NS LX DISP (BAG) ×1
BINDER BREAST LRG (GAUZE/BANDAGES/DRESSINGS) IMPLANT
BINDER BREAST XLRG (GAUZE/BANDAGES/DRESSINGS) IMPLANT
BIOPATCH RED 1 DISK 7.0 (GAUZE/BANDAGES/DRESSINGS) ×1 IMPLANT
CANISTER SUCT 3000ML PPV (MISCELLANEOUS) ×2 IMPLANT
CHLORAPREP W/TINT 26 (MISCELLANEOUS) ×2 IMPLANT
CLIP APPLIE 9.375 MED OPEN (MISCELLANEOUS) ×1 IMPLANT
CNTNR URN SCR LID CUP LEK RST (MISCELLANEOUS) ×1 IMPLANT
CONT SPEC 4OZ STRL OR WHT (MISCELLANEOUS) ×2
COVER PROBE W GEL 5X96 (DRAPES) ×3 IMPLANT
COVER SURGICAL LIGHT HANDLE (MISCELLANEOUS) ×2 IMPLANT
DERMABOND ADVANCED (GAUZE/BANDAGES/DRESSINGS) ×2
DERMABOND ADVANCED .7 DNX12 (GAUZE/BANDAGES/DRESSINGS) ×1 IMPLANT
DEVICE DUBIN SPECIMEN MAMMOGRA (MISCELLANEOUS) ×2 IMPLANT
DRAIN CHANNEL 19F RND (DRAIN) ×1 IMPLANT
DRAPE CHEST BREAST 15X10 FENES (DRAPES) ×2 IMPLANT
DRSG PAD ABDOMINAL 8X10 ST (GAUZE/BANDAGES/DRESSINGS) ×1 IMPLANT
DRSG TEGADERM 4X4.75 (GAUZE/BANDAGES/DRESSINGS) ×1 IMPLANT
ELECT CAUTERY BLADE 6.4 (BLADE) ×2 IMPLANT
ELECT REM PT RETURN 9FT ADLT (ELECTROSURGICAL) ×2
ELECTRODE REM PT RTRN 9FT ADLT (ELECTROSURGICAL) ×1 IMPLANT
EVACUATOR SILICONE 100CC (DRAIN) ×1 IMPLANT
GLOVE SRG 8 PF TXTR STRL LF DI (GLOVE) ×1 IMPLANT
GLOVE SURG ENC MOIS LTX SZ8 (GLOVE) ×2 IMPLANT
GLOVE SURG UNDER POLY LF SZ8 (GLOVE) ×2
GOWN STRL REUS W/ TWL LRG LVL3 (GOWN DISPOSABLE) ×1 IMPLANT
GOWN STRL REUS W/ TWL XL LVL3 (GOWN DISPOSABLE) ×1 IMPLANT
GOWN STRL REUS W/TWL LRG LVL3 (GOWN DISPOSABLE) ×2
GOWN STRL REUS W/TWL XL LVL3 (GOWN DISPOSABLE) ×2
HEMOSTAT ARISTA ABSORB 3G PWDR (HEMOSTASIS) ×1 IMPLANT
KIT BASIN OR (CUSTOM PROCEDURE TRAY) ×2 IMPLANT
KIT MARKER MARGIN INK (KITS) ×2 IMPLANT
LIGHT WAVEGUIDE WIDE FLAT (MISCELLANEOUS) ×1 IMPLANT
NDL 18GX1X1/2 (RX/OR ONLY) (NEEDLE) IMPLANT
NDL HYPO 25GX1X1/2 BEV (NEEDLE) ×1 IMPLANT
NEEDLE 18GX1X1/2 (RX/OR ONLY) (NEEDLE) ×2 IMPLANT
NEEDLE HYPO 25GX1X1/2 BEV (NEEDLE) ×4 IMPLANT
NS IRRIG 1000ML POUR BTL (IV SOLUTION) ×2 IMPLANT
PACK GENERAL/GYN (CUSTOM PROCEDURE TRAY) ×2 IMPLANT
SPONGE T-LAP 18X18 ~~LOC~~+RFID (SPONGE) ×2 IMPLANT
SUT MNCRL AB 4-0 PS2 18 (SUTURE) ×3 IMPLANT
SUT VIC AB 3-0 SH 18 (SUTURE) ×2 IMPLANT
SYR CONTROL 10ML LL (SYRINGE) ×2 IMPLANT
TOWEL GREEN STERILE (TOWEL DISPOSABLE) ×2 IMPLANT
TOWEL GREEN STERILE FF (TOWEL DISPOSABLE) ×2 IMPLANT
TRACER MAGTRACE VIAL (MISCELLANEOUS) ×1 IMPLANT
Tessa allis ratcheting forceps ×1 IMPLANT
Tessa tissue dissectors ×1 IMPLANT
Tessa weitlaner self-retaining retractor ×1 IMPLANT

## 2021-03-10 NOTE — Anesthesia Procedure Notes (Signed)
Procedure Name: LMA Insertion Date/Time: 03/10/2021 12:30 PM Performed by: Jonna Munro, CRNA Pre-anesthesia Checklist: Patient identified, Emergency Drugs available, Suction available, Patient being monitored and Timeout performed Patient Re-evaluated:Patient Re-evaluated prior to induction Oxygen Delivery Method: Circle system utilized Preoxygenation: Pre-oxygenation with 100% oxygen Induction Type: IV induction LMA: LMA inserted LMA Size: 4.0 Number of attempts: 1 Placement Confirmation: positive ETCO2 and breath sounds checked- equal and bilateral Tube secured with: Tape Dental Injury: Teeth and Oropharynx as per pre-operative assessment

## 2021-03-10 NOTE — Interval H&P Note (Signed)
History and Physical Interval Note:  03/10/2021 11:21 AM  Teresa Knapp  has presented today for surgery, with the diagnosis of LEFT BREAST CANCER.  The various methods of treatment have been discussed with the patient and family. After consideration of risks, benefits and other options for treatment, the patient has consented to  Procedure(s): LEFT BREAST LUMPECTOMY WITH RADIOACTIVE SEED AND SENTINEL LYMPH NODE BIOPSY (Left) as a surgical intervention.  The patient's history has been reviewed, patient examined, no change in status, stable for surgery.  I have reviewed the patient's chart and labs.  Questions were answered to the patient's satisfaction.     [place]

## 2021-03-10 NOTE — Discharge Instructions (Addendum)
[place] Office Phone Number [phone]  BREAST BIOPSY/ PARTIAL MASTECTOMY: POST OP INSTRUCTIONS  Always review your discharge instruction sheet given to you by the facility where your surgery was performed.  IF YOU HAVE DISABILITY OR FAMILY LEAVE FORMS, YOU MUST BRING THEM TO THE OFFICE FOR PROCESSING.  DO NOT GIVE THEM TO YOUR DOCTOR.  A prescription for pain medication may be given to you upon discharge.  Take your pain medication as prescribed, if needed.  If narcotic pain medicine is not needed, then you may take acetaminophen (Tylenol) or ibuprofen (Advil) as needed. Take your usually prescribed medications unless otherwise directed If you need a refill on your pain medication, please contact your pharmacy.  They will contact our office to request authorization.  Prescriptions will not be filled after 5pm or on week-ends. You should eat very light the first 24 hours after surgery, such as soup, crackers, pudding, etc.  Resume your normal diet the day after surgery. Most patients will experience some swelling and bruising in the breast.  Ice packs and a good support bra will help.  Swelling and bruising can take several days to resolve.  It is common to experience some constipation if taking pain medication after surgery.  Increasing fluid intake and taking a stool softener will usually help or prevent this problem from occurring.  A mild laxative (Milk of Magnesia or Miralax) should be taken according to package directions if there are no bowel movements after 48 hours. Unless discharge instructions indicate otherwise, you may remove your bandages 24-48 hours after surgery, and you may shower at that time.  You may have steri-strips (small skin tapes) in place directly over the incision.  These strips should be left on the skin for 7-10 days.  If your surgeon used skin glue on the incision, you may shower in 24 hours.  The glue will flake off over the next 2-3 weeks.  Any  sutures or staples will be removed at the office during your follow-up visit. ACTIVITIES:  You may resume regular daily activities (gradually increasing) beginning the next day.  Wearing a good support bra or sports bra minimizes pain and swelling.  You may have sexual intercourse when it is comfortable. You may drive when you no longer are taking prescription pain medication, you can comfortably wear a seatbelt, and you can safely maneuver your car and apply brakes. RETURN TO WORK:  ______________________________________________________________________________________ Teresa Knapp should see your doctor in the office for a follow-up appointment approximately two weeks after your surgery.  Your doctor's nurse will typically make your follow-up appointment when she calls you with your pathology report.  Expect your pathology report 2-3 business days after your surgery.  You may call to check if you do not hear from [place] after three days. OTHER INSTRUCTIONS: _______________________________________________________________________________________________ _____________________________________________________________________________________________________________________________________ _____________________________________________________________________________________________________________________________________ _____________________________________________________________________________________________________________________________________  WHEN TO CALL YOUR DOCTOR: Fever over 101.0 Nausea and/or vomiting. Extreme swelling or bruising. Continued bleeding from incision. Increased pain, redness, or drainage from the incision.  The clinic staff is available to answer your questions during regular business hours.  Please don't hesitate to call and ask to speak to one of the nurses for clinical concerns.  If you have a medical emergency, go to the nearest emergency room or call 911.  A surgeon from [facility] is always on call at the hospital.  For further questions, please visit [email or web address]  G

## 2021-03-10 NOTE — Anesthesia Procedure Notes (Signed)
Anesthesia Regional Block: Pectoralis block   Pre-Anesthetic Checklist: , timeout performed,  Correct Patient, Correct Site, Correct Laterality,  Correct Procedure, Correct Position, site marked,  Risks and benefits discussed,  Surgical consent,  Pre-op evaluation,  At surgeon's request and post-op pain management  Laterality: Left  Prep: Maximum Sterile Barrier Precautions used, chloraprep       Needles:  Injection technique: Single-shot  Needle Type: Echogenic Stimulator Needle     Needle Length: 9cm  Needle Gauge: 22     Additional Needles:   Procedures:,,,, ultrasound used (permanent image in chart),,    Narrative:  Start time: 03/10/2021 12:08 PM End time: 03/10/2021 12:12 PM Injection made incrementally with aspirations every 5 mL.  Performed by: Personally  Anesthesiologist: Freddrick March, MD  Additional Notes: Monitors applied. No increased pain on injection. No increased resistance to injection. Injection made in 5cc increments. Good needle visualization. Patient tolerated procedure well.

## 2021-03-10 NOTE — Op Note (Signed)
Preoperative diagnosis: Stage I left breast cancer upper outer quadrant  Postoperative diagnosis: Same  Procedure: Left breast seed localized lumpectomy with left axillary sentinel lymph node mapping using mag trace  Surgeon: Erroll Luna, MD  Anesthesia: LMA with pectoral block and 0.25% Marcaine plain  EBL: 30 cc  Drains: 19 round to axilla  IV fluids: Per anesthesia record  Indications for procedure: The patient is a 65 year old female with left breast cancer.  She was seen in the multidisciplinary clinic and opted for breast conserving surgery after reviewing all of her surgical options.  She opted for left breast lumpectomy with sentinel lymph node mapping.  Risks and benefits of surgery discussed.  Long-term expectations of surgery discussed.  Survival and recurrence rates discussed with breast surgery.  She opted for left breast seed localized lumpectomy with left axillary sentinel lymph node mapping.The procedure has been discussed with the patient. Alternatives to surgery have been discussed with the patient.  Risks of surgery include bleeding,  Infection,  Seroma formation, death,  and the need for further surgery.   The patient understands and wishes to proceed. Sentinel lymph node mapping and dissection has been discussed with the patient.  Risk of bleeding,  Infection,  Seroma formation,  Additional procedures,,  Shoulder weakness ,  Shoulder stiffness, drain use nerve and blood vessel injury and reaction to the mapping dyes have been discussed.  Alternatives to surgery have been discussed with the patient.  The patient agrees to proceed.     Description of procedure: The patient was met in the holding area and questions were answered.  She underwent pectoral block left anesthesia.  All questions were answered.  She was then taken back to the operating.  She is placed supine upon the OR table.  After induction of general anesthesia, left breast was prepped and draped in a sterile  fashion.  3 cc of mag trace was injected in a subareolar position after timeout.  I massaged this for 5 minutes.  Once this was done neoprobe was used to identify the seed left breast upper outer quadrant.  Films were available for review.  Incision was made along the lateral border of the nipple-areolar complex.  Dissection was carried down all tissue around the seed and clip were excised with a grossly negative margin.  The Faxitron image revealed the seed and clip to be present in the specimen.  Irrigation was used.  Local anesthetic was infiltrated.  Hemostasis was achieved.  Wound closed the deep layer 3-0 Vicryl and 4 Monocryl used to close skin in a subcuticular fashion.  Mag trace probe was then used.  Hotspot identified in left axilla.  There is a very good signal.  Incision made along the inferior axillary hairline.  Dissection was carried down into the level 1 contents.  Identified 2 sentinel nodes and these were removed.  She had some other abnormal appearing nodes in the level 1 basin I took these as well with the initial 2 sentinel nodes.  This was all sent to pathology.  The remainder of the axilla appeared normal.  The mag trace probe was used and there is no significant spikes in activity with remaining lymph nodes.  The long thoracic nerve, thoracodorsal trunk and axillary vein were all preserved.  Hemostasis was achieved.  Arista was placed in the axillary basin.  Due to the number of nodes and area tissue, 19 round drain was placed to help control the postoperative seroma.  This was done through a separate  stab incision along the inferior skin line below the incision and the Zella.  It was secured with 2-0 nylon.  Hemostasis achieved.  We closed the wound with 3-0 Vicryl and 4 Monocryl.  Dermabond was applied.  All counts found to be correct.  Patient was awoke extubated taken to recovery in satisfactory condition.

## 2021-03-10 NOTE — H&P (Signed)
MRN: [id number] DOB: [birth year] DATE OF ENCOUNTER: 02/16/2021 Subjective  Chief Complaint: Breast Cancer   History of Present Illness: Teresa Knapp is a 65 y.o. female who is seen today as an office consultation at the request of Dr. Jana Knapp for evaluation of Breast Cancer .   Patient seen today in the [facility] for stage I left breast cancer. This was discovered on a routine screening mammogram. No history of breast pain, nipple discharge or change in the appearance of the breast.  Review of Systems: A complete review of systems was obtained from the patient. I have reviewed this information and discussed as appropriate with the patient. See HPI as well for other ROS.  Review of Systems  Constitutional: Negative.  HENT: Negative.  Eyes: Negative.  Respiratory: Negative.  Cardiovascular: Negative.  Gastrointestinal: Negative.  Genitourinary: Negative.  Musculoskeletal: Negative.  Skin: Negative.  Neurological: Negative.  Endo/Heme/Allergies: Negative.  Psychiatric/Behavioral: Negative.    Medical History: Past Medical History:  Diagnosis Date   Family history of adverse reaction to anesthesia  Date Unknown   Hyperlipidemia   Thyroid disease   Patient Active Problem List  Diagnosis   Malignant neoplasm of upper-outer quadrant of left breast in female, estrogen receptor positive (CMS-HCC)   Past Surgical History:  Procedure Laterality Date   CESAREAN SECTION N/A  1991 and 1992   CHOLECYSTECTOMY N/A  03/03/2019   lipoma removal N/A  Date unknown   UMBILICAL HERNIA REPAIR N/A  03/03/2019    No Known Allergies  Current Outpatient Medications on File Prior to Visit  Medication Sig Dispense Refill   ergocalciferol, vitamin D2, 1,250 mcg (50,000 unit) capsule 1 CAPSULE TWICE A WEEK ORALLY   metFORMIN (GLUCOPHAGE) 500 MG tablet Take 1 tablet by mouth once daily   rosuvastatin (CRESTOR) 10 MG tablet Take 1 tablet by mouth once daily   No current  facility-administered medications on file prior to visit.   Family History  Problem Relation Age of Onset   High blood pressure (Hypertension) Mother   Cancer Mother   Myocardial Infarction (Heart attack) Father   High blood pressure (Hypertension) Maternal Grandmother   Diabetes Maternal Grandmother   Breast cancer Maternal Grandmother    Social History   Tobacco Use  Smoking Status Never Smoker  Smokeless Tobacco Never Used    Social History   Socioeconomic History   Marital status: Unknown  Tobacco Use   Smoking status: Never Smoker   Smokeless tobacco: Never Used  Scientific laboratory technician Use: Never used  Substance and Sexual Activity   Alcohol use: Yes  Comment: "socially"   Drug use: Never   Objective:  There were no vitals filed for this visit.  There is no height or weight on file to calculate BMI.  Physical Exam Constitutional:  Appearance: Normal appearance.  HENT:  Head: Normocephalic.  Nose: Nose normal.  Eyes:  Pupils: Pupils are equal, round, and reactive to light.  Cardiovascular:  Rate and Rhythm: Normal rate and regular rhythm.  Pulmonary:  Effort: Pulmonary effort is normal.  Breath sounds: No stridor.  Chest:  Breasts:  Right: Normal. No bleeding, inverted nipple or mass.  Left: Normal. No bleeding, inverted nipple or mass.   Musculoskeletal:  General: Normal range of motion.  Cervical back: Normal range of motion.  Lymphadenopathy:  Cervical: No cervical adenopathy.  Upper Body:  Right upper body: No supraclavicular or axillary adenopathy.  Left upper body: No supraclavicular or axillary adenopathy.  Skin: General: Skin is warm  and dry.  Neurological:  General: No focal deficit present.  Mental Status: She is alert and oriented to person, place, and time.  Psychiatric:  Mood and Affect: Mood normal.  Behavior: Behavior normal.     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 HER2 FISH was performed by a technologist and cell imaging and analysis on the [employer]. RATIO OF HER2/CEN17 SIGNALS 1.04 AVERAGE HER2 COPY NUMBER PER CELL 1.35 The ratio of HER2/CEN 17 is within the range < 2.0 of HER2/CEN 17 and a copy number of HER2 signals per cell is <4.0. Arch Pathol Lab Med 1:1,2018 Teresa Knapp Teresa Knapp, Electronic Signature ( Signed 02/15/2021) PROGNOSTIC INDICATORS Results: IMMUNOHISTOCHEMICAL AND MORPHOMETRIC ANALYSIS PERFORMED MANUALLY The tumor cells are EQUIVOCAL for Her2 (2+). HER 2 by FISH will be preformed and the results reported separately Estrogen Receptor: 100%, POSITIVE, STRONG STAINING INTENSITY Progesterone Receptor: 40%, POSITIVE, STRONG STAINING INTENSITY Proliferation Marker Ki67: 15% REFERENCE RANGE ESTROGEN RECEPTOR NEGATIVE 0% POSITIVE =>1% 1 of 3 FINAL for Teresa Knapp ([id number]) ADDITIONAL INFORMATION:(continued) REFERENCE RANGE PROGESTERONE RECEPTOR NEGATIVE 0% POSITIVE =>1% All controls stained appropriately Teresa Knapp Knapp, Electronic Signature ( Signed 02/11/2021) FINAL DIAGNOSIS Diagnosis Breast, left, needle core biopsy, upper outer 2 o'clock - INVASIVE DUCTAL CARCINOMA WITH EXTRACELLULAR MUCIN, GRADE 2. - SEE MICROSCOPIC DESCRIPTION. Microscopic Comment The greatest tumor dimension is 0.6 cm. Breast prognostic profile will be performed. Teresa Knapp Knapp. Called to the [place] 02/09/2021. Teresa Knapp Knapp, Electronic Signature (Case signed 02/09/2021) Specimen Gross and Clinical Information Specimen Comment TIF: 8:45 AM, CIT < 16 minute; 65 year old female with 0.9cm mass Specimen(s)  Patient returns today to evaluate a possible LEFT breast mass questioned on recent screening  mammogram.   EXAM: DIGITAL DIAGNOSTIC UNILATERAL LEFT MAMMOGRAM WITH TOMOSYNTHESIS AND CAD; ULTRASOUND LEFT BREAST LIMITED   TECHNIQUE: Left digital diagnostic mammography and breast tomosynthesis was performed. The images were evaluated with computer-aided detection.; Targeted ultrasound examination of the left breast was performed.   COMPARISON: Previous exams including recent screening mammogram dated 01/13/2021.   ACR Breast Density Category b: There are scattered areas of fibroglandular density.   FINDINGS: On today's additional diagnostic views, a partially obscured mass is confirmed within the upper-outer quadrant of the LEFT breast, 1-2 o'clock axis region, measuring approximately 6 mm greatest dimension.   Targeted ultrasound is performed, showing an irregular mass within the LEFT breast at the 2 o'clock axis, 6 cm from the nipple, measuring 9 x 4 x 6 mm, corresponding to the mammographic finding.   LEFT axilla was evaluated with ultrasound showing no enlarged or morphologically abnormal lymph nodes.   IMPRESSION: Irregular mass within the LEFT breast at the 2 o'clock axis, 6 cm from the nipple, measuring 9 mm, corresponding to the mammographic finding. This is a suspicious finding for which ultrasound-guided biopsy is recommended.   RECOMMENDATION: Ultrasound-guided biopsy for the LEFT breast mass at the 2 o'clock axis, 6 cm from the nipple, measuring 9 mm.   Ultrasound-guided biopsy is scheduled for September 13.   I have discussed the findings and recommendations with the patient. If applicable, a reminder letter will be sent to the patient regarding the next appointment.   BI-RADS CATEGORY 4: Suspicious.     Electronically Signed By: Franki Cabot M.D. On: 02/02/2021 16:26  Assessment  and Plan:  Diagnoses and all orders for this visit:  Malignant neoplasm of upper-outer quadrant of left breast in female, estrogen receptor positive  (CMS-HCC)   Discussed breast conserving surgery versus mastectomy with reconstruction. Long-term expectations, complications, recurrence and additional surgeries and or treatments discussed with the patient. She is opted for left breast seed localized lumpectomy with left axillary sentinel lymph node mapping. Risk of bleeding, infection, cosmetic deformity, drainage, swelling, pain, lymphedema, arm pain, shoulder stiffness or weakness daily for the treatments discussed.  Reviewed 5-year survival recurrence rates of each. She has agreed to proceed with breast conserving surgery  No follow-ups on file.  Kennieth Francois, Knapp   I spent a total of 46 minutes in both face-to-face and non-face-to-face activities for this visit on the date of this encounter. Electronically signed by Kennieth Francois, Knapp

## 2021-03-10 NOTE — Anesthesia Preprocedure Evaluation (Addendum)
Anesthesia Evaluation  Patient identified by MRN, date of birth, ID band Patient awake    Reviewed: Allergy & Precautions, NPO status , Patient's Chart, lab work & pertinent test results  Airway Mallampati: I  TM Distance: >3 FB Neck ROM: Full    Dental no notable dental hx. (+) Teeth Intact, Dental Advisory Given   Pulmonary neg pulmonary ROS,    Pulmonary exam normal breath sounds clear to auscultation       Cardiovascular Normal cardiovascular exam Rhythm:Regular Rate:Normal  HLD   Neuro/Psych negative neurological ROS  negative psych ROS   GI/Hepatic negative GI ROS, Neg liver ROS,   Endo/Other  negative endocrine ROS  Renal/GU negative Renal ROS  negative genitourinary   Musculoskeletal negative musculoskeletal ROS (+)   Abdominal   Peds  Hematology negative hematology ROS (+)   Anesthesia Other Findings Left breast CA  Reproductive/Obstetrics                            Anesthesia Physical Anesthesia Plan  ASA: 2  Anesthesia Plan: General and Regional   Post-op Pain Management:  Regional for Post-op pain   Induction: Intravenous  PONV Risk Score and Plan: 3 and Ondansetron, Dexamethasone and Midazolam  Airway Management Planned: LMA  Additional Equipment:   Intra-op Plan:   Post-operative Plan: Extubation in OR  Informed Consent: I have reviewed the patients History and Physical, chart, labs and discussed the procedure including the risks, benefits and alternatives for the proposed anesthesia with the patient or authorized representative who has indicated his/her understanding and acceptance.     Dental advisory given  Plan Discussed with: CRNA  Anesthesia Plan Comments:         Anesthesia Quick Evaluation

## 2021-03-10 NOTE — OR Nursing (Signed)
[place] @ [phone], spoke to Dr. Purcell Nails, who verified seed and clip placement.

## 2021-03-10 NOTE — Transfer of Care (Signed)
Immediate Anesthesia Transfer of Care Note  Patient: Teresa Knapp  Procedure(s) Performed: LEFT BREAST LUMPECTOMY WITH RADIOACTIVE SEED AND SENTINEL LYMPH NODE BIOPSY (Left: Breast)  Patient Location: PACU  Anesthesia Type:General  Level of Consciousness: awake, alert , oriented and patient cooperative  Airway & Oxygen Therapy: Patient Spontanous Breathing and Patient connected to nasal cannula oxygen  Post-op Assessment: Report given to RN, Post -op Vital signs reviewed and stable and Patient moving all extremities X 4  Post vital signs: Reviewed and stable  Last Vitals:  Vitals Value Taken Time  BP 128/71 03/10/21 1416  Temp    Pulse 76 03/10/21 1417  Resp 20 03/10/21 1417  SpO2 95 % 03/10/21 1417  Vitals shown include unvalidated device data.  Last Pain:  Vitals:   03/10/21 1022  TempSrc: Oral         Complications: No notable events documented.

## 2021-03-11 ENCOUNTER — Encounter (HOSPITAL_COMMUNITY): Payer: Self-pay | Admitting: Surgery

## 2021-03-14 LAB — SURGICAL PATHOLOGY

## 2021-03-15 ENCOUNTER — Telehealth: Payer: Self-pay | Admitting: *Deleted

## 2021-03-15 ENCOUNTER — Encounter: Payer: Self-pay | Admitting: *Deleted

## 2021-03-15 ENCOUNTER — Other Ambulatory Visit (HOSPITAL_COMMUNITY)
Admission: RE | Admit: 2021-03-15 | Discharge: 2021-03-15 | Disposition: A | Discharge status: Home / Self Care | Payer: Medicare HMO | Source: Ambulatory Visit | Attending: Radiology | Admitting: Radiology

## 2021-03-15 ENCOUNTER — Encounter: Payer: Self-pay | Admitting: Surgery

## 2021-03-15 ENCOUNTER — Ambulatory Visit
Admission: RE | Admit: 2021-03-15 | Discharge: 2021-03-15 | Disposition: A | Discharge status: Home / Self Care | Payer: Medicare HMO | Source: Ambulatory Visit | Attending: Family Medicine | Admitting: Family Medicine

## 2021-03-15 DIAGNOSIS — E0789 Other specified disorders of thyroid: Secondary | ICD-10-CM | POA: Diagnosis not present

## 2021-03-15 DIAGNOSIS — E041 Nontoxic single thyroid nodule: Secondary | ICD-10-CM | POA: Diagnosis not present

## 2021-03-15 NOTE — Anesthesia Postprocedure Evaluation (Signed)
Anesthesia Post Note  Patient: Lynnsie Maureen Chatters Mims  Procedure(s) Performed: LEFT BREAST LUMPECTOMY WITH RADIOACTIVE SEED AND SENTINEL LYMPH NODE BIOPSY (Left: Breast)     Patient location during evaluation: PACU Anesthesia Type: Regional and General Level of consciousness: awake and alert Pain management: pain level controlled Vital Signs Assessment: post-procedure vital signs reviewed and stable Respiratory status: spontaneous breathing, nonlabored ventilation, respiratory function stable and patient connected to nasal cannula oxygen Cardiovascular status: blood pressure returned to baseline and stable Postop Assessment: no apparent nausea or vomiting Anesthetic complications: no   No notable events documented.  Last Vitals:  Vitals:   03/10/21 1445 03/10/21 1448  BP:  116/78  Pulse:  71  Resp: 16 16  Temp:  (!) 36.2 C  SpO2:  96%    Last Pain:  Vitals:   03/10/21 1448  TempSrc:   PainSc: 0-No pain                 Alyanna Stoermer

## 2021-03-15 NOTE — Telephone Encounter (Signed)
Ordered oncotype per Dr. Jana Hakim. Faxed requisition to [facility].

## 2021-03-16 LAB — CYTOLOGY - NON PAP

## 2021-03-24 ENCOUNTER — Ambulatory Visit: Payer: BC Managed Care – PPO | Admitting: Nurse Practitioner

## 2021-03-24 DIAGNOSIS — Z170 Estrogen receptor positive status [ER+]: Secondary | ICD-10-CM | POA: Diagnosis not present

## 2021-03-24 DIAGNOSIS — C50412 Malignant neoplasm of upper-outer quadrant of left female breast: Secondary | ICD-10-CM | POA: Diagnosis not present

## 2021-03-28 ENCOUNTER — Encounter (HOSPITAL_COMMUNITY): Payer: Self-pay

## 2021-03-30 ENCOUNTER — Encounter: Payer: Self-pay | Admitting: *Deleted

## 2021-03-30 ENCOUNTER — Telehealth: Payer: Self-pay | Admitting: *Deleted

## 2021-03-30 DIAGNOSIS — C50412 Malignant neoplasm of upper-outer quadrant of left female breast: Secondary | ICD-10-CM

## 2021-03-30 DIAGNOSIS — Z170 Estrogen receptor positive status [ER+]: Secondary | ICD-10-CM

## 2021-03-30 NOTE — Telephone Encounter (Signed)
Received Oncotype score of 12. Physician team notified. Called pt, discussed results. Informed pt chemo not recommended based on these results and xrt is next step. Received verbal understanding.

## 2021-03-31 ENCOUNTER — Telehealth: Payer: Self-pay | Admitting: Radiation Oncology

## 2021-04-01 ENCOUNTER — Telehealth: Payer: Self-pay | Admitting: Radiation Oncology

## 2021-04-05 ENCOUNTER — Telehealth: Payer: Self-pay | Admitting: Radiation Oncology

## 2021-04-05 ENCOUNTER — Ambulatory Visit: Payer: Medicare HMO | Attending: Surgery

## 2021-04-05 ENCOUNTER — Other Ambulatory Visit: Payer: Self-pay

## 2021-04-05 DIAGNOSIS — R293 Abnormal posture: Secondary | ICD-10-CM | POA: Diagnosis not present

## 2021-04-05 DIAGNOSIS — C50412 Malignant neoplasm of upper-outer quadrant of left female breast: Secondary | ICD-10-CM | POA: Diagnosis not present

## 2021-04-05 DIAGNOSIS — Z170 Estrogen receptor positive status [ER+]: Secondary | ICD-10-CM | POA: Diagnosis not present

## 2021-04-05 NOTE — Patient Instructions (Addendum)
Pt given information about ABC class, Reminded about performing exercises to be sure she doesn't experience shoulder tightness with radiation, and to keep check on breast for swelling and arm.  Added small chip pack for firm area (likely small hematoma) in left breast.  Also discussed zip up sports bra with pack to decrease firmness.

## 2021-04-05 NOTE — Therapy (Signed)
[place] @ [facility] [address] [place], [place], [address] Phone: [phone]   Fax:  [phone]  Physical Therapy Treatment  Patient Details  Name: Teresa Knapp MRN: [id number] Date of Birth: [birth year] Referring Provider (PT): Dr. Erroll Luna   Encounter Date: 04/05/2021   PT End of Session - 04/05/21 1657     Visit Number 2    Number of Visits 2    Date for PT Re-Evaluation 04/05/21    PT Start Time 1604    PT Stop Time 1650    PT Time Calculation (min) 46 min    Activity Tolerance Patient tolerated treatment well    Behavior During Therapy [facility] for tasks assessed/performed             Past Medical History:  Diagnosis Date   Arthritis    Family history of adverse reaction to anesthesia    mother had a possible reaction to anesthesia (possible jerking) not sure    Past Surgical History:  Procedure Laterality Date   BREAST LUMPECTOMY WITH RADIOACTIVE SEED AND SENTINEL LYMPH NODE BIOPSY Left 03/10/2021   Procedure: LEFT BREAST LUMPECTOMY WITH RADIOACTIVE SEED AND SENTINEL LYMPH NODE BIOPSY;  Surgeon: Erroll Luna, MD;  Location: [facility];  Service: General;  Laterality: Left;   [place] and [place] N/A 03/03/2019   Procedure: LAPAROSCOPIC CHOLECYSTECTOMY WITH INTRAOPERATIVE CHOLANGIOGRAM;  Surgeon: Armandina Gemma, MD;  Location: [facility];  Service: General;  Laterality: N/A;   lipoma removal     right side of neck   UMBILICAL HERNIA REPAIR N/A 03/03/2019   Procedure: PRIMARY REPAIR OF UMBILICAL HERNIA;  Surgeon: Armandina Gemma, MD;  Location: [facility];  Service: General;  Laterality: N/A;    There were no vitals filed for this visit.   Subjective Assessment - 04/05/21 1603     Subjective Pt had left lumpectomy on 03/10/2021. Does not think she has any problems.  No pain or limitations she is aware of and no swelling. Drain was removed on Mar 18, 2021.  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 It measures 1.1 cm and is located in the upper outer quadrant. It is ER/PR positive and HER2 negative with a Ki67 of 30%. She had a left lumpectomy with SLNB on 03/10/2021 with 0/3 LN's.  She will not have chemo therapy, but may require radiation. She had a right total knee replacement in 2018 and a partial left knee replacement in 2008.    Patient Stated Goals post op reassessment    Currently in Pain? No/denies                [facility] PT Assessment - 04/05/21 0001       Assessment   Medical Diagnosis Left breast cancer    Referring Provider (PT) Dr. Marcello Moores [place]    Onset Date/Surgical Date 03/10/21    Hand Dominance Right    Prior Therapy baseline      Precautions   Precaution Comments lymphedema risk      Restrictions   Weight Bearing Restrictions No      Balance Screen   Has the patient fallen in the past 6 months No    Has the patient had a decrease in activity level because of a fear of falling?  No    Is  the patient reluctant to leave their home because of a fear of falling?  No      Home Environment   Living Environment Private residence    Living Arrangements Alone    Available Help at Discharge Family      Prior Function   Level of Independence Independent    Vocation Retired    Chiropodist   Overall Cognitive Status Within Functional Limits for tasks assessed      Posture/Postural Control   Posture/Postural Control Postural limitations    Postural Limitations Rounded Shoulders;Forward head      AROM   Right/Left Shoulder Right;Left    Left Shoulder Extension 65 Degrees    Left Shoulder Flexion 159 Degrees    Left Shoulder ABduction 168 Degrees    Left Shoulder External Rotation 89 Degrees                LYMPHEDEMA/ONCOLOGY QUESTIONNAIRE - 04/05/21 0001       Type   Cancer Type Left breast cancer      Surgeries   Lumpectomy Date 03/10/21    Sentinel Lymph Node Biopsy Date 03/10/21    Number Lymph Nodes Removed 3      Treatment   Active Chemotherapy Treatment No    Past Chemotherapy Treatment No    Active Radiation Treatment No   pending pssible radiation   Past Radiation Treatment No      What other symptoms do you have   Are you Having Heaviness or Tightness No    Are you having Pain No    Are you having pitting edema No    Is it Hard or Difficult finding clothes that fit No    Do you have infections No      Right Upper Extremity Lymphedema   Olecranon Process 25.5 cm    10 cm Proximal to Ulnar Styloid Process 20.3 cm    Just Proximal to Ulnar Styloid Process 16.3 cm    At Base of 2nd Digit 6 cm      Left Upper Extremity Lymphedema   10 cm Proximal to Olecranon Process 30.6 cm    Olecranon Process 26 cm    10 cm Proximal to Ulnar Styloid Process 20.25 cm    Just Proximal to Ulnar Styloid Process 16.5 cm    At Base of 2nd Digit 5.6 cm                Quick Dash - 04/05/21 0001     Open a tight or new jar No difficulty    Do heavy household chores (wash walls, wash floors) No difficulty    Carry a shopping bag or briefcase No difficulty    Wash your back No difficulty    Use a knife to cut food No difficulty    Recreational activities in which you take some force or impact through your arm, shoulder, or hand (golf, hammering, tennis) No difficulty    During the past week, to what extent has your arm, shoulder or hand problem interfered with your normal social activities with family, friends, neighbors, or groups? Not at all    During the past week, to what extent has your arm, shoulder or hand problem limited your work or other regular daily activities Not at all    Arm, shoulder, or hand pain. None    Tingling (pins and needles) in your arm, shoulder, or hand  None    Difficulty  Sleeping No difficulty    DASH Score 0 %                             PT Education - 04/05/21 1656     Education Details ABC class, SOZO, post op shoulder exs, chip pack, scar massage when healed    Person(s) Educated Patient    Methods Explanation;Handout    Comprehension Verbalized understanding                 PT Long Term Goals - 04/05/21 1705       PT LONG TERM GOAL #1   Title Patient will demonstrate she has regained full shoulder ROM and function post operatively compared to baselines.    Time 8    Period Weeks    Status Achieved    Target Date 04/05/21                   Plan - 04/05/21 1657     Clinical Impression Statement Pt was seen for post op reassessment.  Pt had left lumpectomy with SLNB and 0/3 LN's on 03/10/2021.  Shoulder ROM has been restored and pt has no complaints of pain.  Circumference measures have decreased since last visit.  Incisions are nearly healed but with a small area of scabs still present.  She was educated on scar massage when scabs fall off. She has a small fibrotic area in the left breast with a small bruise noted. pt was given a piece of half in foam, and a small chip pack to place in this area to soften.  It was suggested she try a zip up sports bra to hold the pack in and to decrease fibrosis. We discussed keeping check on shoulder ROM throughout radiation, and contacting us or MD if she experiences any breast or arm swelling.  She was given education on ABC class and encouraged to attend. She is substitute teaching nearly full time now. Quick dash is 0% today    Stability/Clinical Decision Making Stable/Uncomplicated    Rehab Potential Excellent    PT Treatment/Interventions ADLs/Self Care Home Management;Therapeutic exercise;Patient/family education;Scar mobilization    PT Next Visit Plan Pt discharged to HEP. Pt to call and schedule ABC class/3 month SOZO    PT Home Exercise Plan Post op  shoulder ROM HEP, scar massage. chip pack for small area of left breast fibrosis    Consulted and Agree with Plan of Care Patient             Patient will benefit from skilled therapeutic intervention in order to improve the following deficits and impairments:  Postural dysfunction, Decreased range of motion, Decreased knowledge of precautions, Impaired UE functional use  Visit Diagnosis: Malignant neoplasm of upper-outer quadrant of left breast in female, estrogen receptor positive ([place])  Abnormal posture     Problem List Patient Active Problem List   Diagnosis Date Noted   Malignant neoplasm of upper-outer quadrant of left breast in female, estrogen receptor positive ([place]) 02/14/2021   Cholelithiasis with chronic cholecystitis 03/02/2019  PHYSICAL THERAPY DISCHARGE SUMMARY  Visits from Start of Care: 2  Current functional level related to goals / functional outcomes: Achieved goals   Remaining deficits: Small area of left breast fibrosis;given chip pack   Education / Equipment: Chip pack   Patient agrees to discharge. Patient goals were met. Patient is being discharged due to meeting the stated rehab goals.   Elsie Ra  [place], PT 04/05/2021, 5:06 PM  [place] @ [address] [place], [place], [address] Phone: [phone]   Fax:  [phone]  Name: Teresa Knapp MRN: [id number] Date of Birth: 08/15/1955

## 2021-04-07 ENCOUNTER — Telehealth: Payer: Self-pay | Admitting: *Deleted

## 2021-04-07 ENCOUNTER — Telehealth: Payer: Self-pay

## 2021-04-07 ENCOUNTER — Telehealth: Payer: Self-pay | Admitting: Radiation Oncology

## 2021-04-07 ENCOUNTER — Encounter: Payer: Self-pay | Admitting: *Deleted

## 2021-04-07 DIAGNOSIS — Z853 Personal history of malignant neoplasm of breast: Secondary | ICD-10-CM | POA: Diagnosis not present

## 2021-04-07 DIAGNOSIS — Z6828 Body mass index (BMI) 28.0-28.9, adult: Secondary | ICD-10-CM | POA: Diagnosis not present

## 2021-04-07 DIAGNOSIS — E042 Nontoxic multinodular goiter: Secondary | ICD-10-CM | POA: Diagnosis not present

## 2021-04-07 NOTE — Telephone Encounter (Signed)
Rad/onc called to let me know they have tried to call this pt 4X and LVM each time to get her scheduled. She is not returning calls or answering. Attempted to call pt and LVM for her to return call to [facility]. Deferred information to nurse navigators.

## 2021-04-07 NOTE — Telephone Encounter (Signed)
Called pt to discuss missed calls from radonc to schedule with dr. Lisbeth Renshaw for xrt. 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 Informed pt she will receive a call from someone in rad onc after 4pm to get scheduled with Dr. Lisbeth Renshaw. Received verbal understanding.

## 2021-04-11 ENCOUNTER — Ambulatory Visit (INDEPENDENT_AMBULATORY_CARE_PROVIDER_SITE_OTHER): Payer: Medicare HMO | Admitting: Nurse Practitioner

## 2021-04-11 ENCOUNTER — Other Ambulatory Visit: Payer: Self-pay

## 2021-04-11 ENCOUNTER — Encounter: Payer: Self-pay | Admitting: *Deleted

## 2021-04-11 ENCOUNTER — Encounter: Payer: Self-pay | Admitting: Nurse Practitioner

## 2021-04-11 VITALS — BP 124/80 | Ht 65.5 in | Wt 175.0 lb

## 2021-04-11 DIAGNOSIS — M8589 Other specified disorders of bone density and structure, multiple sites: Secondary | ICD-10-CM

## 2021-04-11 DIAGNOSIS — C50412 Malignant neoplasm of upper-outer quadrant of left female breast: Secondary | ICD-10-CM

## 2021-04-11 DIAGNOSIS — Z780 Asymptomatic menopausal state: Secondary | ICD-10-CM

## 2021-04-11 DIAGNOSIS — Z170 Estrogen receptor positive status [ER+]: Secondary | ICD-10-CM | POA: Diagnosis not present

## 2021-04-11 DIAGNOSIS — Z9289 Personal history of other medical treatment: Secondary | ICD-10-CM

## 2021-04-11 DIAGNOSIS — Z01419 Encounter for gynecological examination (general) (routine) without abnormal findings: Secondary | ICD-10-CM | POA: Diagnosis not present

## 2021-04-11 NOTE — Progress Notes (Signed)
Teresa Knapp Oct 15, 1955 [id number]   History:  65 y.o. G2 P2 presents for breast and pelvic exam without GYN complaints. Postmenopausal - no HRT, no bleeding.  Normal pap history.  01/2021 stage 1A invasive ductal carcinoma of left breast, ER/PR + HER-2 negative without lymph node involvement. 02/2021 lumpectomy with seed placement. In discussion with oncology regarding further treatment - possible radiation and antiestrogen therapy.   Gynecologic History No LMP recorded. Patient is postmenopausal.   Contraception/Family planning: post menopausal status Sexually active: Yes  Health Maintenance Last Pap: 09/10/2018. Results were: Normal Last mammogram: 01/13/2021. Results were: possible asymmetry of left breast - biopsy confirmed grade 2 invasive ductal carcinoma Last colonoscopy: 2018. Results were: Normal Last Dexa: 12/12/2018. Results were: T-score -1.7, FRAX 4.2% / 0.5%  Past medical history, past surgical history, family history and social history were all reviewed and documented in the EPIC chart. Married. Retired Licensed conveyancer, Surveyor, quantity. Daughter lives in [place], son in [place].  ROS:  A ROS was performed and pertinent positives and negatives are included.  Exam:  Vitals:   04/11/21 1604  BP: 124/80  Weight: 175 lb (79.4 kg)  Height: 5' 5.5" (1.664 m)    Body mass index is 28.68 kg/m.  General appearance:  Normal Thyroid:  Symmetrical, normal in size, without palpable masses or nodularity. Respiratory  Auscultation:  Clear without wheezing or rhonchi Cardiovascular  Auscultation:  Regular rate, without rubs, murmurs or gallops  Edema/varicosities:  Not grossly evident Abdominal  Soft,nontender, without masses, guarding or rebound.  Liver/spleen:  No organomegaly noted  Hernia:  None appreciated  Skin  Inspection:  Grossly normal   Breasts: Declines Genitourinary   Inguinal/mons:  Normal without inguinal adenopathy  External  genitalia:  Normal appearing vulva with no masses, tenderness, or lesions  BUS/Urethra/Skene's glands:  Normal  Vagina:  Normal appearing with normal color and discharge, no lesions  Cervix:  Normal appearing without discharge or lesions  Uterus:  Normal in size, shape and contour.  Midline and mobile, nontender  Adnexa/parametria:     Rt: Normal in size, without masses or tenderness.   Lt: Normal in size, without masses or tenderness.  Anus and perineum: Normal  Digital rectal exam: Normal sphincter tone without palpated masses or tenderness  Patient informed chaperone available to be present for breast and pelvic exam. Patient has requested no chaperone to be present. Patient has been advised what will be completed during breast and pelvic exam.   Assessment/Plan:  65 y.o.  for breast and pelvic exam.  Well female exam with routine gynecological exam Education provided on SBEs, importance of preventative screenings, current guidelines, low carb/low calorie diet, regular exercise, and daily vitamin D and calcium supplement. Labs done elsewhere.   Postmenopausal - Plan: DG Bone Density. No HRT, no bleeding.   Osteopenia of multiple sites - Plan: DG Bone Density. T-score -1.7 in July 2020. Recommend repeating DXA now.   Malignant neoplasm of upper-outer quadrant of left breast in female, estrogen receptor positive ([place]) - 01/2021 stage 1A invasive ductal carcinoma of left breast, ER/PR + HER-2 negative without lymph node involvement. 02/2021 lumpectomy with seed placement. In discussion with oncology regarding further treatment - possible radiation and antiestrogen therapy.  Screening for cervical cancer - Normal Pap history.  No longer screening per guidelines.   Screening for colon cancer - 2018. She is unsure of when recommended follow up is and will call to find out.   Follow up in 1 year for  annual.       Tamela Gammon [facility], 4:48 PM 04/11/2021

## 2021-04-14 ENCOUNTER — Other Ambulatory Visit: Payer: Self-pay

## 2021-04-14 ENCOUNTER — Ambulatory Visit
Admission: RE | Admit: 2021-04-14 | Discharge: 2021-04-14 | Disposition: A | Discharge status: Home / Self Care | Payer: Medicare HMO | Source: Ambulatory Visit | Attending: Radiation Oncology | Admitting: Radiation Oncology

## 2021-04-14 ENCOUNTER — Encounter: Payer: Self-pay | Admitting: Radiation Oncology

## 2021-04-14 VITALS — BP 136/86 | HR 73 | Temp 96.0°F | Resp 18 | Ht 65.5 in | Wt 173.5 lb

## 2021-04-14 DIAGNOSIS — C50412 Malignant neoplasm of upper-outer quadrant of left female breast: Secondary | ICD-10-CM | POA: Diagnosis not present

## 2021-04-14 DIAGNOSIS — Z170 Estrogen receptor positive status [ER+]: Secondary | ICD-10-CM | POA: Diagnosis not present

## 2021-04-14 DIAGNOSIS — Z803 Family history of malignant neoplasm of breast: Secondary | ICD-10-CM | POA: Insufficient documentation

## 2021-04-14 NOTE — Progress Notes (Signed)
Radiation Oncology         [phone] ________________________________  Name: Venna Zamiah Tollett        MRN: [id number]  Date of Service: 04/14/2021 DOB: [birth year]  MG:NOIBBC, Lattie Haw, MD  Magrinat, Virgie Dad, MD     REFERRING PHYSICIAN: Magrinat, Virgie Dad, MD   DIAGNOSIS: The encounter diagnosis was Malignant neoplasm of upper-outer quadrant of left breast in female, estrogen receptor positive ([place]).   HISTORY OF PRESENT ILLNESS: Teresa Knapp is a 65 y.o. female originally seen in the multidisciplinary breast clinic for a new diagnosis of left breast cancer. The patient was noted to have a possible mass on screening mammogram that showed a 9 mm mass by ultrasound in the 2:00 position of the left breast and the axilla was negative for adenopathy. 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 The tumor was ER/PR positive, HER2 negative with a Ki 67 of 15%.  .  Since her last visit, she has undergone left lumpectomy with sentinel lymph node biopsy on 03/10/2021.  Final pathology revealed invasive ductal carcinoma with extracellular mucin measuring 1.2 cm, this remains grade 2, and resection margins were negative for carcinoma.  Fibrocystic change with usual ductal hyperplasia and apocrine metaplasia were identified.  She had 3 sampled lymph nodes that were all negative for metastatic disease and anterior superior margin medial posterior margin and lateral inferior margin all of which were negative for disease.  She had Oncotype DX score submitted and this revealed a score of 12 and no systemic therapy is recommended.  She has also undergone a biopsy of her thyroid which revealed scant follicular epithelium negative for malignancy.  She is seen today to discuss next steps for adjuvant radiotherapy to the left breast.     PREVIOUS RADIATION THERAPY: No   PAST MEDICAL HISTORY:  Past Medical History:  Diagnosis Date   Arthritis     Cancer ([place])    Breast cancer   Family history of adverse reaction to anesthesia    mother had a possible reaction to anesthesia (possible jerking) not sure       PAST SURGICAL HISTORY: Past Surgical History:  Procedure Laterality Date   BIOPSY THYROID N/A    BREAST LUMPECTOMY WITH RADIOACTIVE SEED AND SENTINEL LYMPH NODE BIOPSY Left 03/10/2021   Procedure: LEFT BREAST LUMPECTOMY WITH RADIOACTIVE SEED AND SENTINEL LYMPH NODE BIOPSY;  Surgeon: Erroll Luna, MD;  Location: [facility];  Service: General;  Laterality: Left;   [place] and [place] N/A 03/03/2019   Procedure: LAPAROSCOPIC CHOLECYSTECTOMY WITH INTRAOPERATIVE CHOLANGIOGRAM;  Surgeon: Armandina Gemma, MD;  [facility];  Service: General;  Laterality: N/A;   lipoma removal     right side of neck   UMBILICAL HERNIA REPAIR N/A 03/03/2019   Procedure: PRIMARY REPAIR OF UMBILICAL HERNIA;  Surgeon: Armandina Gemma, MD;  [facility];  Service: General;  Laterality: N/A;     FAMILY HISTORY:  Family History  Problem Relation Age of Onset   Cancer Mother        lung   Hypertension Mother    Heart attack Father    Breast cancer Maternal Grandmother    Diabetes Maternal Grandmother    Hypertension Maternal Grandmother      SOCIAL HISTORY:  reports that she has never smoked. She has never used smokeless tobacco. She reports current alcohol use of about 1.0 standard drink per week. She reports that she  does not use drugs. The patient is married and lives in [place]. She is a retired Proofreader and continues to Psychologist, prison and probation services.     ALLERGIES: Patient has no known allergies.   MEDICATIONS:  Current Outpatient Medications  Medication Sig Dispense Refill   ergocalciferol (VITAMIN D2) 1.25 MG (50000 UT) capsule Take 50,000 Units by mouth once a week.     ibuprofen (ADVIL) 800 MG tablet Take 1 tablet (800 mg total) by mouth every 8 (eight) hours as needed. 30 tablet 0   rosuvastatin  (CRESTOR) 10 MG tablet Take 10 mg by mouth daily.     No current facility-administered medications for this encounter.     REVIEW OF SYSTEMS: On review of systems, the patient reports that she is doing well overall. She feels like she's healing up well and does not have any specific complaints.      PHYSICAL EXAM:  Wt Readings from Last 3 Encounters:  04/14/21 173 lb 8 oz (78.7 kg)  04/11/21 175 lb (79.4 kg)  03/10/21 172 lb (78 kg)   Temp Readings from Last 3 Encounters:  04/14/21 (!) 96 F (35.6 C) (Temporal)  03/10/21 (!) 97.2 F (36.2 C)  03/01/21 98.1 F (36.7 C) (Oral)   BP Readings from Last 3 Encounters:  04/14/21 136/86  04/11/21 124/80  03/10/21 116/78   Pulse Readings from Last 3 Encounters:  04/14/21 73  03/10/21 71  03/01/21 84    In general this is a well appearing African American female in no acute distress. She's alert and oriented x4 and appropriate throughout the examination. Cardiopulmonary assessment is negative for acute distress and she exhibits normal effort. The left breast reveals a well healed lumpectomy and separate left axillary incision site. No erythema, separation, or drainage is noted.    ECOG = 0  0 - Asymptomatic (Fully active, able to carry on all predisease activities without restriction)  1 - Symptomatic but completely ambulatory (Restricted in physically strenuous activity but ambulatory and able to carry out work of a light or sedentary nature. For example, light housework, office work)  2 - Symptomatic, <50% in bed during the day (Ambulatory and capable of all self care but unable to carry out any work activities. Up and about more than 50% of waking hours)  3 - Symptomatic, >50% in bed, but not bedbound (Capable of only limited self-care, confined to bed or chair 50% or more of waking hours)  4 - Bedbound (Completely disabled. Cannot carry on any self-care. Totally confined to bed or chair)  5 - Death   Eustace Pen MM, Creech RH,  Tormey DC, et al. [phone]). "Toxicity and response criteria of the [facility]". [place] Oncol. 5 (6): 649-55    LABORATORY DATA:  Lab Results  Component Value Date   WBC 9.4 03/01/2021   HGB 12.9 03/01/2021   HCT 39.3 03/01/2021   MCV 92.7 03/01/2021   PLT 249 03/01/2021   Lab Results  Component Value Date   NA 138 03/01/2021   K 4.0 03/01/2021   CL 105 03/01/2021   CO2 25 03/01/2021   Lab Results  Component Value Date   ALT 17 03/01/2021   AST 18 03/01/2021   ALKPHOS 67 03/01/2021   BILITOT 0.8 03/01/2021      RADIOGRAPHY: [place] FNA BX THYROID 1ST LESION AFIRMA  Result Date: 03/15/2021 INDICATION: Indeterminate left inferior thyroid nodule. Previously biopsied on 01/20/2021 with none diagnostic result. EXAM: ULTRASOUND GUIDED FINE NEEDLE ASPIRATION OF  INDETERMINATE LEFT INFERIOR THYROID NODULE COMPARISON:  Thyroid ultrasound performed December 29, 2020. Thyroid nodule biopsy performed on January 20 2021 MEDICATIONS: 1% plain lidocaine, 1 mL COMPLICATIONS: None immediate. TECHNIQUE: Informed written consent was obtained from the patient after a discussion of the risks, benefits and alternatives to treatment. Questions regarding the procedure were encouraged and answered. A timeout was performed prior to the initiation of the procedure. Pre-procedural ultrasound scanning demonstrated unchanged size and appearance of the indeterminate nodule within the left thyroid lobe. The procedure was planned. The neck was prepped in the usual sterile fashion, and a sterile drape was applied covering the operative field. A timeout was performed prior to the initiation of the procedure. Local anesthesia was provided with 1% lidocaine. Under direct ultrasound guidance, 7 FNA biopsies were performed of the left inferior thyroid nodule with a 25 gauge needle. Multiple ultrasound images were saved for procedural documentation purposes. The samples were prepared and submitted to  pathology. Limited post procedural scanning was negative for hematoma or additional complication. Dressings were placed. The patient tolerated the above procedures procedure well without immediate postprocedural complication. FINDINGS: FINDINGS Nodule reference number based on prior diagnostic ultrasound: 5 Maximum size: 2.0 cm Location: Left  ;  Inferior ACR TI-RADS total points: ACR TI-RADS risk category:  TR4 (4-6 points) Prior biopsy:  Yes Reason for biopsy: nondiagnostic on prior biopsy Ultrasound imaging confirms appropriate placement of the needles within the thyroid nodule. IMPRESSION: Technically successful ultrasound guided fine needle aspiration of left inferior thyroid nodule as described above. Read by: Ascencion Dike PA-C Electronically Signed   By: Jerilynn Mages.  Shick M.D.   On: 03/15/2021 16:34       IMPRESSION/PLAN: 1. Stage IA, pT1cN0M0 grade 2, ER/PR positive invasive ductal carcinoma of the left breast. Dr. Lisbeth Renshaw discusses the final pathology findings and reviews the nature of left breast disease. She has done well surgically and no systemic chemo has been recommended. She is now ready to proceed with external radiotherapy to the breast  to reduce risks of local recurrence followed by antiestrogen therapy. We discussed the risks, benefits, short, and long term effects of radiotherapy, as well as the curative intent, and the patient is interested in proceeding. Dr. Lisbeth Renshaw discusses the delivery and logistics of radiotherapy and recommends 4 weeks of radiotherapy to the left breast with deep inspiration breath hold technique. Written consent is obtained and placed in the chart, a copy was provided to the patient. She will simulate tomorrow.   In a visit lasting 45 minutes, greater than 50% of the time was spent face to face reviewing her case, as well as in preparation of, discussing, and coordinating the patient's care.  The above documentation reflects my direct findings during this shared patient  visit. Please see the separate note by Dr. Lisbeth Renshaw on this date for the remainder of the patient's plan of care.    Carola Rhine, [facility]    **Disclaimer: This note was dictated with voice recognition software. Similar sounding words can inadvertently be transcribed and this note may contain transcription errors which may not have been corrected upon publication of note.**

## 2021-04-14 NOTE — Progress Notes (Signed)
Patient states doing well. No symptoms reported at this time.  Postmenopausal- No chances of pregnancy. Meaningful use complete.  BP 136/86 (BP Location: Right Arm)   Pulse 73   Temp (!) 96 F (35.6 C) (Temporal)   Resp 18   Ht 5' 5.5" (1.664 m)   Wt 173 lb 8 oz (78.7 kg)   SpO2 100%   BMI 28.43 kg/m

## 2021-04-15 ENCOUNTER — Ambulatory Visit
Admission: RE | Admit: 2021-04-15 | Discharge: 2021-04-15 | Disposition: A | Discharge status: Home / Self Care | Payer: Medicare HMO | Source: Ambulatory Visit | Attending: Radiation Oncology | Admitting: Radiation Oncology

## 2021-04-15 ENCOUNTER — Other Ambulatory Visit: Payer: Self-pay

## 2021-04-15 DIAGNOSIS — C50412 Malignant neoplasm of upper-outer quadrant of left female breast: Secondary | ICD-10-CM | POA: Insufficient documentation

## 2021-04-15 DIAGNOSIS — Z510 Encounter for antineoplastic radiation therapy: Secondary | ICD-10-CM | POA: Insufficient documentation

## 2021-04-19 ENCOUNTER — Other Ambulatory Visit: Payer: Self-pay

## 2021-04-19 ENCOUNTER — Inpatient Hospital Stay: Payer: Medicare HMO | Attending: Oncology | Admitting: Oncology

## 2021-04-19 VITALS — BP 147/74 | HR 74 | Temp 97.7°F | Resp 16 | Ht 65.0 in | Wt 174.6 lb

## 2021-04-19 DIAGNOSIS — R918 Other nonspecific abnormal finding of lung field: Secondary | ICD-10-CM | POA: Diagnosis not present

## 2021-04-19 DIAGNOSIS — C50412 Malignant neoplasm of upper-outer quadrant of left female breast: Secondary | ICD-10-CM | POA: Diagnosis not present

## 2021-04-19 DIAGNOSIS — E041 Nontoxic single thyroid nodule: Secondary | ICD-10-CM | POA: Diagnosis not present

## 2021-04-19 DIAGNOSIS — Z170 Estrogen receptor positive status [ER+]: Secondary | ICD-10-CM | POA: Insufficient documentation

## 2021-04-19 DIAGNOSIS — Z801 Family history of malignant neoplasm of trachea, bronchus and lung: Secondary | ICD-10-CM | POA: Insufficient documentation

## 2021-04-19 DIAGNOSIS — Z803 Family history of malignant neoplasm of breast: Secondary | ICD-10-CM | POA: Insufficient documentation

## 2021-04-19 NOTE — Progress Notes (Signed)
[place]  [phone] [phone]    ID: Teresa Knapp DOB: 1955-12-21  MR#: [id number]  MBW#:[id number]  Patient Care Team: Kathyrn Lass, MD as PCP - General (Family Medicine) Mauro Kaufmann, RN as Oncology Nurse Navigator Rockwell Germany, RN as Oncology Nurse Navigator Erroll Luna, MD as Consulting Physician (General Surgery) Jisele Price, Virgie Dad, MD as Consulting Physician (Oncology) Kyung Rudd, MD as Consulting Physician (Radiation Oncology) Chauncey Cruel, MD OTHER MD:  CHIEF COMPLAINT: estrogen receptor positive breast cancer  CURRENT TREATMENT: pending adjuvant radiation   INTERVAL HISTORY: [name] returns today for follow up of her estrogen receptor positive breast cancer. She was evaluated in the multidisciplinary breast cancer clinic on 02/16/2021.  She proceeded to left lumpectomy on 03/10/2021 under Dr. Brantley Stage. Pathology from the procedure ([id number]) showed: invasive ductal carcinoma with extracellular mucin, 1.2 cm, grade 2; resection margins negative.    All three biopsied lymph nodes were negative.  Oncotype DX was obtained on the final surgical sample and the recurrence score of 12 predicts a risk of recurrence outside the breast over the next 9 years of 3%, if the patient's only systemic therapy is an antiestrogen for 5 years.  It also predicts no benefit from chemotherapy.   She was referred back to Dr. Lisbeth Renshaw and PA Bryson Ha on 04/14/2021 to review radiation therapy. She is scheduled to receive treatment from 05/02/2021 through 05/31/2021.  Of note, she also underwent thyroid aspiration on 03/15/2021. Cytology from the procedure ([id number]) showed: scant follicular epithelium present.   REVIEW OF SYSTEMS: Rilyn tolerated surgery well.  She also benefited from physical therapy and she tells me her range of motion is excellent at this point.  She is looking forward to adjuvant radiation which we will start  the first week in December and go through the first week in January.  She never stopped working right through all her treatments.  A detailed review of systems today was otherwise stable.   COVID 19 VACCINATION STATUS: Pfizer x4, most recently 10/2020   HISTORY OF CURRENT ILLNESS: From the original intake note:  "Teresa Knapp" had routine screening mammography on 01/13/2021 showing a possible abnormality in the left breast. She underwent left diagnostic mammography with tomography and left breast ultrasonography at [facility] on 02/02/2021 showing: breast density category B; 9 mm irregular mass in left breast at 2 o'clock; no enlarged or morphologically abnormal lymph nodes.  Accordingly on 02/08/2021 she proceeded to biopsy of the left breast area in question. The pathology from this procedure ([id number]) showed: invasive ductal carcinoma with extracellular mucin, grade 2. Prognostic indicators significant for: estrogen receptor, 100% positive and progesterone receptor, 40% positive, both with strong staining intensity. Proliferation marker Ki67 at 15%. HER2 equivocal by immunohistochemistry (2+), but negative by fluorescent in situ hybridization with a signals ratio 1.04 and number per cell 1.35.   Cancer Staging  Malignant neoplasm of upper-outer quadrant of left breast in female, estrogen receptor positive ([place]) Staging form: Breast, AJCC 8th Edition - Clinical stage from 02/16/2021: Stage IA (cT1b, cN0, cM0, G2, ER+, PR+, HER2-) - Signed by Chauncey Cruel, MD on 02/16/2021 Stage prefix: Initial diagnosis Method of lymph node assessment: Clinical Histologic grading system: 3 grade system  The patient's subsequent history is as detailed below.   PAST MEDICAL HISTORY: Past Medical History:  Diagnosis Date   Arthritis    Cancer [facility])    Breast cancer   Family history of adverse reaction to anesthesia  mother had a possible reaction to anesthesia (possible jerking) not sure    PAST  SURGICAL HISTORY: Past Surgical History:  Procedure Laterality Date   BIOPSY THYROID N/A    BREAST LUMPECTOMY WITH RADIOACTIVE SEED AND SENTINEL LYMPH NODE BIOPSY Left 03/10/2021   Procedure: LEFT BREAST LUMPECTOMY WITH RADIOACTIVE SEED AND SENTINEL LYMPH NODE BIOPSY;  Surgeon: Erroll Luna, MD;  Location: [place];  Service: General;  Laterality: Left;   [place] N/A 03/03/2019   Procedure: LAPAROSCOPIC CHOLECYSTECTOMY WITH INTRAOPERATIVE CHOLANGIOGRAM;  Surgeon: Armandina Gemma, MD;  Location: [facility];  Service: General;  Laterality: N/A;   lipoma removal     right side of neck   UMBILICAL HERNIA REPAIR N/A 03/03/2019   Procedure: PRIMARY REPAIR OF UMBILICAL HERNIA;  Surgeon: Armandina Gemma, MD;  Location: [facility];  Service: General;  Laterality: N/A;    FAMILY HISTORY: Family History  Problem Relation Age of Onset   Cancer Mother        lung   Hypertension Mother    Heart attack Father    Breast cancer Maternal Grandmother    Diabetes Maternal Grandmother    Hypertension Maternal Grandmother    Her father died around age 22 from MI. Her mother died at age 28 from lung cancer. She was diagnosed at age 64 and was a tobacco user. Miriah had one brother and one sister, both are deceased. In addition to her mother, she reports breast cancer in her maternal grandmother.   GYNECOLOGIC HISTORY:  No LMP recorded. Patient is postmenopausal. Menarche: 65 years old Age at first live birth: 65 years old [place] P 2 LMP 09/2004 Contraceptive: used for 12 years HRT never used  Hysterectomy? no BSO? no   SOCIAL HISTORY: (updated 01/2021)  [name] works as a Proofreader. Husband Mortimer Fries is retired from working in [employer] for [employer]. Daughter Teresa Knapp, age 64, works in Investment banker, corporate in [place], [place]. Son Teresa Knapp, age 27, is a Marine stationed in [place]; he is in his 2nd of 4 years in [place]. Kieryn has three step-grandchildren. She attends a Radiographer, therapeutic  church.    ADVANCED DIRECTIVES: In the absence of any documentation to the contrary, the patient's spouse is their HCPOA.    HEALTH MAINTENANCE: Social History   Tobacco Use   Smoking status: Never   Smokeless tobacco: Never  Vaping Use   Vaping Use: Never used  Substance Use Topics   Alcohol use: Yes    Alcohol/week: 1.0 standard drink    Types: 1 Glasses of wine per week    Comment: SOCIAL   Drug use: Never     Colonoscopy: 02/2015  PAP: 08/2018, negative  Bone density: 11/2018, -1.7   No Known Allergies  Current Outpatient Medications  Medication Sig Dispense Refill   ergocalciferol (VITAMIN D2) 1.25 MG (50000 UT) capsule Take 50,000 Units by mouth once a week.     ibuprofen (ADVIL) 800 MG tablet Take 1 tablet (800 mg total) by mouth every 8 (eight) hours as needed. 30 tablet 0   rosuvastatin (CRESTOR) 10 MG tablet Take 10 mg by mouth daily.     No current facility-administered medications for this visit.    OBJECTIVE: African-American woman who appears younger than stated age  65:   04/19/21 1547  BP: (!) 147/74  Pulse: 74  Resp: 16  Temp: 97.7 F (36.5 C)  SpO2: 100%     Body mass index is 29.05 kg/m.  Wt Readings from Last 3 Encounters:  04/19/21 174 lb 9.6 oz (79.2 kg)  04/14/21 173 lb 8 oz (78.7 kg)  04/11/21 175 lb (79.4 kg)      ECOG FS:1 - Symptomatic but completely ambulatory  Sclerae unicteric, EOMs intact Wearing a mask No cervical or supraclavicular adenopathy Lungs no rales or rhonchi Heart regular rate and rhythm Abd soft, nontender, positive bowel sounds MSK no focal spinal tenderness, no upper extremity lymphedema Neuro: nonfocal, well oriented, appropriate affect Breasts: The right breast is benign.  The left breast has undergone lumpectomy.  The cosmetic result is excellent.  There is no evidence of residual or recurrent disease.  Both axillae are benign.   LAB RESULTS:  CMP     Component Value Date/Time   Knapp 138  03/01/2021 1550   K 4.0 03/01/2021 1550   CL 105 03/01/2021 1550   CO2 25 03/01/2021 1550   GLUCOSE 103 (H) 03/01/2021 1550   BUN 12 03/01/2021 1550   CREATININE 0.79 03/01/2021 1550   CREATININE 0.74 02/16/2021 0835   CALCIUM 9.3 03/01/2021 1550   PROT 6.8 03/01/2021 1550   ALBUMIN 3.6 03/01/2021 1550   AST 18 03/01/2021 1550   AST 17 02/16/2021 0835   ALT 17 03/01/2021 1550   ALT 13 02/16/2021 0835   ALKPHOS 67 03/01/2021 1550   BILITOT 0.8 03/01/2021 1550   BILITOT 0.8 02/16/2021 0835   GFRNONAA >60 03/01/2021 1550   GFRNONAA >60 02/16/2021 0835   GFRAA >60 02/27/2019 0841    No results found for: TOTALPROTELP, ALBUMINELP, A1GS, A2GS, BETS, BETA2SER, GAMS, MSPIKE, SPEI  Lab Results  Component Value Date   WBC 9.4 03/01/2021   NEUTROABS 6.2 03/01/2021   HGB 12.9 03/01/2021   HCT 39.3 03/01/2021   MCV 92.7 03/01/2021   PLT 249 03/01/2021    No results found for: LABCA2  No components found for: JHERDE081  No results for input(s): INR in the last 168 hours.  No results found for: LABCA2  No results found for: KGY185  No results found for: UDJ497  No results found for: WYO378  No results found for: CA2729  No components found for: HGQUANT  No results found for: CEA1 / No results found for: CEA1   No results found for: AFPTUMOR  No results found for: CHROMOGRNA  No results found for: KPAFRELGTCHN, LAMBDASER, KAPLAMBRATIO (kappa/lambda light chains)  No results found for: HGBA, HGBA2QUANT, HGBFQUANT, HGBSQUAN (Hemoglobinopathy evaluation)   No results found for: LDH  No results found for: IRON, TIBC, IRONPCTSAT (Iron and TIBC)  No results found for: FERRITIN  Urinalysis    Component Value Date/Time   COLORURINE YELLOW 01/29/2019 2210   APPEARANCEUR HAZY (A) 01/29/2019 2210   LABSPEC 1.014 01/29/2019 2210   PHURINE 6.0 01/29/2019 2210   GLUCOSEU NEGATIVE 01/29/2019 2210   HGBUR NEGATIVE 01/29/2019 2210   BILIRUBINUR NEGATIVE 01/29/2019 2210    KETONESUR 20 (A) 01/29/2019 2210   PROTEINUR NEGATIVE 01/29/2019 2210   NITRITE NEGATIVE 01/29/2019 2210   LEUKOCYTESUR LARGE (A) 01/29/2019 2210    STUDIES: No results found.   ELIGIBLE FOR AVAILABLE RESEARCH PROTOCOL: no  ASSESSMENT: 65 y.o. [place] woman status post left breast upper outer quadrant biopsy 02/08/2021 for a clinical T1b N0, stage IA invasive ductal carcinoma, grade 2, estrogen and progesterone receptor positive, HER2 not amplified, with an MIB-1 of 15%.  (1) status post left breast upper outer quadrant lumpectomy 03/10/2021 for a pT1c pN0, stage IA invasive ductal carcinoma, grade 2, with  negative margins.  (a) a total of 3 left axillary lymph nodes were removed  (2) Oncotype score of 12 predicts a risk of recurrence outside the breast over the next 9 years of 3% if the patient only systemic therapy is antiestrogens for 5 years.  It also predicts no benefit from adjuvant chemotherapy.  (3) adjuvant radiation i 05/02/2021 through 05/31/2021  (4) antiestrogens to start at the end of local treatment  (5) pulmonary nodule 0.2 cm incidentally noted on 08/24/2020 scan  (a) stable on repeat noncontrast CT of the chest 12/02/2020  (6) indeterminate thyroid nodule biopsied 01/20/2021 showed scant follicular epithelium ([place] [facility])  (A) repeat biopsy 03/15/2021 showed scant follicular epithelium   PLAN: Neta did well with her surgery.  She is ready for adjuvant radiation.  Today we discussed her Oncotype in detail so she understands that she has an excellent chance of the cancer not returning outside the breast without the help of chemotherapy, if she does take antiestrogens for 5 years.  She understands that she will have a low risk of the cancer coming back in the breast after radiation and that that low risk will be cut pretty much in half by taking antiestrogens as planned.  We then discussed the difference between tamoxifen and anastrozole in detail. She  understands that anastrozole and the aromatase inhibitors in general work by blocking estrogen production. Accordingly vaginal dryness, decrease in bone density, and of course hot flashes can result. The aromatase inhibitors can also negatively affect the cholesterol profile, although that is a minor effect. One out of 5 women on aromatase inhibitors we will feel "old and achy". This arthralgia/myalgia syndrome, which resembles fibromyalgia clinically, does resolve with stopping the medications. Accordingly this is not a reason to not try an aromatase inhibitor but it is a frequent reason to stop it (in other words 20% of women will not be able to tolerate these medications).  Tamoxifen on the other hand does not block estrogen production. It does not "take away a woman's estrogen". It blocks the estrogen receptor in breast cells. Like anastrozole, it can also cause hot flashes. As opposed to anastrozole, tamoxifen has many estrogen-like effects. It is technically an estrogen receptor modulator. This means that in some tissues tamoxifen works like estrogen-- for example it helps strengthen the bones. It tends to improve the cholesterol profile. It can cause thickening of the endometrial lining, and even endometrial polyps or rarely cancer of the uterus.(The risk of uterine cancer due to tamoxifen is one additional cancer per thousand women year). It can cause vaginal wetness or stickiness. It can cause blood clots through this estrogen-like effect--the risk of blood clots with tamoxifen is exactly the same as with birth control pills or hormone replacement.  Neither of these agents causes mood changes or weight gain, despite the popular belief that they can have these side effects. We have data from studies comparing either of these drugs with placebo, and in those cases the control group had the same amount of weight gain and depression as the group that took the drug.  She is going to study the difference  between these drugs and return to see [place] late January, after she has recovered from radiation, to discuss any questions she may have and make a definitive decision of which of these medications to start.  She knows to call for any other issue that may develop before the next visit.  Total encounter time 35 minutes.Sarajane Jews C. Zyrus Hetland, MD  04/19/2021 3:49 PM Medical Oncology and Hematology [facility] [place], [place] [address] Tel. [phone]    Fax. [phone]   This document serves as a record of services personally performed by Lurline Del, MD. It was created on his behalf by Wilburn Mylar, a trained medical scribe. The creation of this record is based on the scribe's personal observations and the provider's statements to them.   I, Lurline Del MD, have reviewed the above documentation for accuracy and completeness, and I agree with the above.   *Total Encounter Time as defined by the Centers for Medicare and Medicaid Services includes, in addition to the face-to-face time of a patient visit (documented in the note above) non-face-to-face time: obtaining and reviewing outside history, ordering and reviewing medications, tests or procedures, care coordination (communications with other health care professionals or caregivers) and documentation in the medical record.

## 2021-04-20 ENCOUNTER — Telehealth: Payer: Self-pay | Admitting: Oncology

## 2021-04-20 NOTE — Telephone Encounter (Signed)
Scheduled appointment per 11/22 los. Patient is aware. 

## 2021-04-26 ENCOUNTER — Encounter: Payer: Self-pay | Admitting: *Deleted

## 2021-04-26 ENCOUNTER — Ambulatory Visit: Payer: Medicare HMO | Admitting: Radiation Oncology

## 2021-04-27 ENCOUNTER — Ambulatory Visit: Payer: Medicare HMO

## 2021-04-28 ENCOUNTER — Ambulatory Visit: Payer: Medicare HMO

## 2021-04-28 ENCOUNTER — Ambulatory Visit
Admission: RE | Admit: 2021-04-28 | Discharge: 2021-04-28 | Disposition: A | Discharge status: Home / Self Care | Payer: Medicare HMO | Source: Ambulatory Visit | Attending: Radiation Oncology | Admitting: Radiation Oncology

## 2021-04-28 DIAGNOSIS — C50412 Malignant neoplasm of upper-outer quadrant of left female breast: Secondary | ICD-10-CM | POA: Diagnosis not present

## 2021-04-28 DIAGNOSIS — Z170 Estrogen receptor positive status [ER+]: Secondary | ICD-10-CM | POA: Insufficient documentation

## 2021-04-29 ENCOUNTER — Ambulatory Visit: Payer: Medicare HMO

## 2021-05-02 DIAGNOSIS — Z170 Estrogen receptor positive status [ER+]: Secondary | ICD-10-CM | POA: Diagnosis not present

## 2021-05-02 DIAGNOSIS — C50412 Malignant neoplasm of upper-outer quadrant of left female breast: Secondary | ICD-10-CM | POA: Diagnosis not present

## 2021-05-03 ENCOUNTER — Ambulatory Visit
Admission: RE | Admit: 2021-05-03 | Discharge: 2021-05-03 | Disposition: A | Discharge status: Home / Self Care | Payer: Medicare HMO | Source: Ambulatory Visit | Attending: Radiation Oncology | Admitting: Radiation Oncology

## 2021-05-03 ENCOUNTER — Other Ambulatory Visit: Payer: Self-pay

## 2021-05-03 DIAGNOSIS — C50412 Malignant neoplasm of upper-outer quadrant of left female breast: Secondary | ICD-10-CM | POA: Diagnosis not present

## 2021-05-03 DIAGNOSIS — Z170 Estrogen receptor positive status [ER+]: Secondary | ICD-10-CM | POA: Diagnosis not present

## 2021-05-04 ENCOUNTER — Ambulatory Visit
Admission: RE | Admit: 2021-05-04 | Discharge: 2021-05-04 | Disposition: A | Discharge status: Home / Self Care | Payer: Medicare HMO | Source: Ambulatory Visit | Attending: Radiation Oncology | Admitting: Radiation Oncology

## 2021-05-04 DIAGNOSIS — C50412 Malignant neoplasm of upper-outer quadrant of left female breast: Secondary | ICD-10-CM | POA: Diagnosis not present

## 2021-05-04 DIAGNOSIS — Z170 Estrogen receptor positive status [ER+]: Secondary | ICD-10-CM | POA: Diagnosis not present

## 2021-05-05 ENCOUNTER — Other Ambulatory Visit: Payer: Self-pay

## 2021-05-05 ENCOUNTER — Ambulatory Visit
Admission: RE | Admit: 2021-05-05 | Discharge: 2021-05-05 | Disposition: A | Discharge status: Home / Self Care | Payer: Medicare HMO | Source: Ambulatory Visit | Attending: Radiation Oncology | Admitting: Radiation Oncology

## 2021-05-05 DIAGNOSIS — Z170 Estrogen receptor positive status [ER+]: Secondary | ICD-10-CM | POA: Diagnosis not present

## 2021-05-05 DIAGNOSIS — C50412 Malignant neoplasm of upper-outer quadrant of left female breast: Secondary | ICD-10-CM | POA: Diagnosis not present

## 2021-05-06 ENCOUNTER — Other Ambulatory Visit: Payer: Self-pay

## 2021-05-06 ENCOUNTER — Ambulatory Visit
Admission: RE | Admit: 2021-05-06 | Discharge: 2021-05-06 | Disposition: A | Discharge status: Home / Self Care | Payer: Medicare HMO | Source: Ambulatory Visit | Attending: Radiation Oncology | Admitting: Radiation Oncology

## 2021-05-06 DIAGNOSIS — C50412 Malignant neoplasm of upper-outer quadrant of left female breast: Secondary | ICD-10-CM | POA: Diagnosis not present

## 2021-05-06 DIAGNOSIS — Z170 Estrogen receptor positive status [ER+]: Secondary | ICD-10-CM

## 2021-05-06 MED ORDER — RADIAPLEXRX EX GEL: Freq: Once | CUTANEOUS | Status: AC

## 2021-05-06 MED ORDER — ALRA NON-METALLIC DEODORANT (RAD-ONC)
1.0000 "application " | Freq: Once | TOPICAL | Status: AC
  Administered 2021-05-06: 1 via TOPICAL

## 2021-05-06 NOTE — Progress Notes (Signed)
Pt here for patient teaching.  Pt given Radiation and You booklet, skin care instructions, Alra deodorant, and Radiaplex gel.  Reviewed areas of pertinence such as fatigue, hair loss, skin changes, breast tenderness, and breast swelling . Pt able to give teach back of to pat skin and use unscented/gentle soap,apply Radiaplex bid, avoid applying anything to skin within 4 hours of treatment, avoid wearing an under wire bra, and to use an electric razor if they must shave. Pt verbalizes understanding of information given and will contact nursing with any questions or concerns.    Cristie Mckinney M. Felise Georgia RN, BSN      

## 2021-05-09 ENCOUNTER — Ambulatory Visit
Admission: RE | Admit: 2021-05-09 | Discharge: 2021-05-09 | Disposition: A | Discharge status: Home / Self Care | Payer: Medicare HMO | Source: Ambulatory Visit | Attending: Radiation Oncology | Admitting: Radiation Oncology

## 2021-05-09 DIAGNOSIS — C50412 Malignant neoplasm of upper-outer quadrant of left female breast: Secondary | ICD-10-CM | POA: Diagnosis not present

## 2021-05-09 DIAGNOSIS — Z170 Estrogen receptor positive status [ER+]: Secondary | ICD-10-CM | POA: Diagnosis not present

## 2021-05-10 ENCOUNTER — Ambulatory Visit
Admission: RE | Admit: 2021-05-10 | Discharge: 2021-05-10 | Disposition: A | Discharge status: Home / Self Care | Payer: Medicare HMO | Source: Ambulatory Visit | Attending: Radiation Oncology | Admitting: Radiation Oncology

## 2021-05-10 ENCOUNTER — Other Ambulatory Visit: Payer: Self-pay

## 2021-05-10 DIAGNOSIS — Z170 Estrogen receptor positive status [ER+]: Secondary | ICD-10-CM | POA: Diagnosis not present

## 2021-05-10 DIAGNOSIS — C50412 Malignant neoplasm of upper-outer quadrant of left female breast: Secondary | ICD-10-CM | POA: Diagnosis not present

## 2021-05-11 ENCOUNTER — Other Ambulatory Visit: Payer: Self-pay

## 2021-05-11 ENCOUNTER — Ambulatory Visit
Admission: RE | Admit: 2021-05-11 | Discharge: 2021-05-11 | Disposition: A | Discharge status: Home / Self Care | Payer: Medicare HMO | Source: Ambulatory Visit | Attending: Radiation Oncology | Admitting: Radiation Oncology

## 2021-05-11 DIAGNOSIS — Z170 Estrogen receptor positive status [ER+]: Secondary | ICD-10-CM | POA: Diagnosis not present

## 2021-05-11 DIAGNOSIS — C50412 Malignant neoplasm of upper-outer quadrant of left female breast: Secondary | ICD-10-CM | POA: Diagnosis not present

## 2021-05-12 ENCOUNTER — Ambulatory Visit
Admission: RE | Admit: 2021-05-12 | Discharge: 2021-05-12 | Disposition: A | Discharge status: Home / Self Care | Payer: Medicare HMO | Source: Ambulatory Visit | Attending: Radiation Oncology | Admitting: Radiation Oncology

## 2021-05-12 DIAGNOSIS — Z170 Estrogen receptor positive status [ER+]: Secondary | ICD-10-CM | POA: Diagnosis not present

## 2021-05-12 DIAGNOSIS — C50412 Malignant neoplasm of upper-outer quadrant of left female breast: Secondary | ICD-10-CM | POA: Diagnosis not present

## 2021-05-13 ENCOUNTER — Ambulatory Visit
Admission: RE | Admit: 2021-05-13 | Discharge: 2021-05-13 | Disposition: A | Discharge status: Home / Self Care | Payer: Medicare HMO | Source: Ambulatory Visit | Attending: Radiation Oncology | Admitting: Radiation Oncology

## 2021-05-13 ENCOUNTER — Ambulatory Visit: Payer: Medicare HMO | Admitting: Radiation Oncology

## 2021-05-13 ENCOUNTER — Other Ambulatory Visit: Payer: Self-pay

## 2021-05-13 DIAGNOSIS — Z170 Estrogen receptor positive status [ER+]: Secondary | ICD-10-CM | POA: Diagnosis not present

## 2021-05-13 DIAGNOSIS — C50412 Malignant neoplasm of upper-outer quadrant of left female breast: Secondary | ICD-10-CM | POA: Diagnosis not present

## 2021-05-16 ENCOUNTER — Ambulatory Visit
Admission: RE | Admit: 2021-05-16 | Discharge: 2021-05-16 | Disposition: A | Discharge status: Home / Self Care | Payer: Medicare HMO | Source: Ambulatory Visit | Attending: Radiation Oncology | Admitting: Radiation Oncology

## 2021-05-16 ENCOUNTER — Other Ambulatory Visit: Payer: Self-pay

## 2021-05-16 DIAGNOSIS — Z170 Estrogen receptor positive status [ER+]: Secondary | ICD-10-CM | POA: Diagnosis not present

## 2021-05-16 DIAGNOSIS — C50412 Malignant neoplasm of upper-outer quadrant of left female breast: Secondary | ICD-10-CM | POA: Diagnosis not present

## 2021-05-17 ENCOUNTER — Other Ambulatory Visit: Payer: Self-pay | Admitting: Nurse Practitioner

## 2021-05-17 ENCOUNTER — Ambulatory Visit
Admission: RE | Admit: 2021-05-17 | Discharge: 2021-05-17 | Disposition: A | Discharge status: Home / Self Care | Payer: Medicare HMO | Source: Ambulatory Visit | Attending: Radiation Oncology | Admitting: Radiation Oncology

## 2021-05-17 ENCOUNTER — Ambulatory Visit (INDEPENDENT_AMBULATORY_CARE_PROVIDER_SITE_OTHER): Payer: Medicare HMO

## 2021-05-17 DIAGNOSIS — M8589 Other specified disorders of bone density and structure, multiple sites: Secondary | ICD-10-CM

## 2021-05-17 DIAGNOSIS — Z780 Asymptomatic menopausal state: Secondary | ICD-10-CM

## 2021-05-17 DIAGNOSIS — C50412 Malignant neoplasm of upper-outer quadrant of left female breast: Secondary | ICD-10-CM | POA: Diagnosis not present

## 2021-05-17 DIAGNOSIS — Z170 Estrogen receptor positive status [ER+]: Secondary | ICD-10-CM | POA: Diagnosis not present

## 2021-05-18 ENCOUNTER — Other Ambulatory Visit: Payer: Self-pay

## 2021-05-18 ENCOUNTER — Ambulatory Visit
Admission: RE | Admit: 2021-05-18 | Discharge: 2021-05-18 | Disposition: A | Discharge status: Home / Self Care | Payer: Medicare HMO | Source: Ambulatory Visit | Attending: Radiation Oncology | Admitting: Radiation Oncology

## 2021-05-18 DIAGNOSIS — Z170 Estrogen receptor positive status [ER+]: Secondary | ICD-10-CM | POA: Diagnosis not present

## 2021-05-18 DIAGNOSIS — C50412 Malignant neoplasm of upper-outer quadrant of left female breast: Secondary | ICD-10-CM | POA: Diagnosis not present

## 2021-05-19 ENCOUNTER — Ambulatory Visit
Admission: RE | Admit: 2021-05-19 | Discharge: 2021-05-19 | Disposition: A | Discharge status: Home / Self Care | Payer: Medicare HMO | Source: Ambulatory Visit | Attending: Radiation Oncology | Admitting: Radiation Oncology

## 2021-05-19 DIAGNOSIS — C50412 Malignant neoplasm of upper-outer quadrant of left female breast: Secondary | ICD-10-CM | POA: Diagnosis not present

## 2021-05-19 DIAGNOSIS — Z170 Estrogen receptor positive status [ER+]: Secondary | ICD-10-CM | POA: Diagnosis not present

## 2021-05-20 ENCOUNTER — Ambulatory Visit
Admission: RE | Admit: 2021-05-20 | Discharge: 2021-05-20 | Disposition: A | Discharge status: Home / Self Care | Payer: Medicare HMO | Source: Ambulatory Visit | Attending: Radiation Oncology | Admitting: Radiation Oncology

## 2021-05-20 ENCOUNTER — Ambulatory Visit: Payer: Medicare HMO | Admitting: Radiation Oncology

## 2021-05-20 ENCOUNTER — Other Ambulatory Visit: Payer: Self-pay

## 2021-05-20 DIAGNOSIS — C50412 Malignant neoplasm of upper-outer quadrant of left female breast: Secondary | ICD-10-CM | POA: Diagnosis not present

## 2021-05-20 DIAGNOSIS — Z170 Estrogen receptor positive status [ER+]: Secondary | ICD-10-CM | POA: Diagnosis not present

## 2021-05-24 ENCOUNTER — Ambulatory Visit
Admission: RE | Admit: 2021-05-24 | Discharge: 2021-05-24 | Disposition: A | Discharge status: Home / Self Care | Payer: Medicare HMO | Source: Ambulatory Visit | Attending: Radiation Oncology | Admitting: Radiation Oncology

## 2021-05-24 ENCOUNTER — Other Ambulatory Visit: Payer: Self-pay

## 2021-05-24 ENCOUNTER — Ambulatory Visit: Payer: Medicare HMO

## 2021-05-24 DIAGNOSIS — Z170 Estrogen receptor positive status [ER+]: Secondary | ICD-10-CM | POA: Diagnosis not present

## 2021-05-24 DIAGNOSIS — C50412 Malignant neoplasm of upper-outer quadrant of left female breast: Secondary | ICD-10-CM | POA: Diagnosis not present

## 2021-05-25 ENCOUNTER — Ambulatory Visit
Admission: RE | Admit: 2021-05-25 | Discharge: 2021-05-25 | Disposition: A | Discharge status: Home / Self Care | Payer: Medicare HMO | Source: Ambulatory Visit | Attending: Radiation Oncology | Admitting: Radiation Oncology

## 2021-05-25 DIAGNOSIS — Z170 Estrogen receptor positive status [ER+]: Secondary | ICD-10-CM | POA: Diagnosis not present

## 2021-05-25 DIAGNOSIS — C50412 Malignant neoplasm of upper-outer quadrant of left female breast: Secondary | ICD-10-CM | POA: Diagnosis not present

## 2021-05-26 ENCOUNTER — Other Ambulatory Visit: Payer: Self-pay

## 2021-05-26 ENCOUNTER — Ambulatory Visit
Admission: RE | Admit: 2021-05-26 | Discharge: 2021-05-26 | Disposition: A | Discharge status: Home / Self Care | Payer: Medicare HMO | Source: Ambulatory Visit | Attending: Radiation Oncology | Admitting: Radiation Oncology

## 2021-05-26 DIAGNOSIS — Z170 Estrogen receptor positive status [ER+]: Secondary | ICD-10-CM | POA: Diagnosis not present

## 2021-05-26 DIAGNOSIS — C50412 Malignant neoplasm of upper-outer quadrant of left female breast: Secondary | ICD-10-CM | POA: Diagnosis not present

## 2021-05-27 ENCOUNTER — Ambulatory Visit
Admission: RE | Admit: 2021-05-27 | Discharge: 2021-05-27 | Disposition: A | Discharge status: Home / Self Care | Payer: Medicare HMO | Source: Ambulatory Visit | Attending: Radiation Oncology | Admitting: Radiation Oncology

## 2021-05-27 DIAGNOSIS — Z170 Estrogen receptor positive status [ER+]: Secondary | ICD-10-CM | POA: Diagnosis not present

## 2021-05-27 DIAGNOSIS — C50412 Malignant neoplasm of upper-outer quadrant of left female breast: Secondary | ICD-10-CM | POA: Diagnosis not present

## 2021-05-30 ENCOUNTER — Ambulatory Visit: Payer: Medicare HMO

## 2021-05-31 ENCOUNTER — Other Ambulatory Visit: Payer: Self-pay

## 2021-05-31 ENCOUNTER — Ambulatory Visit
Admission: RE | Admit: 2021-05-31 | Discharge: 2021-05-31 | Disposition: A | Discharge status: Home / Self Care | Payer: Medicare HMO | Source: Ambulatory Visit | Attending: Radiation Oncology | Admitting: Radiation Oncology

## 2021-05-31 ENCOUNTER — Encounter: Payer: Self-pay | Admitting: Radiation Oncology

## 2021-05-31 ENCOUNTER — Encounter: Payer: Self-pay | Admitting: *Deleted

## 2021-05-31 DIAGNOSIS — Z170 Estrogen receptor positive status [ER+]: Secondary | ICD-10-CM

## 2021-05-31 DIAGNOSIS — C50412 Malignant neoplasm of upper-outer quadrant of left female breast: Secondary | ICD-10-CM | POA: Insufficient documentation

## 2021-06-08 NOTE — Progress Notes (Signed)
° °                                                                                                                                                          °  Patient Name: Teresa Knapp MRN: [id number] DOB: 1956/05/01 Referring Physician: Kathyrn Lass (Profile Not Attached) Date of Service: 05/31/2021 [facility], [place]                                                        End Of Treatment Note  Diagnoses: C50.412-Malignant neoplasm of upper-outer quadrant of left female breast  Cancer Staging: Stage IA, pT1cN0M0 grade 2, ER/PR positive invasive ductal carcinoma of the left breast.  Intent: Curative  Radiation Treatment Dates: 05/02/2021 through 05/31/2021 Site Technique Total Dose (Gy) Dose per Fx (Gy) Completed Fx Beam Energies  Breast, Left: Breast_L 3D 42.56/42.56 2.66 16/16 10XFFF  Breast, Left: Breast_L_Bst 3D 8/8 2 4/4 6X, 10X   Narrative: The patient tolerated radiation therapy relatively well. She developed anticipated skin changes in the treatment field.   Plan: The patient will receive a call in about one month from the radiation oncology department. She will continue follow up with Dr. Chryl Heck as well.   ________________________________________________    Carola Rhine, [facility]

## 2021-06-23 ENCOUNTER — Inpatient Hospital Stay: Payer: Medicare HMO | Attending: Oncology | Admitting: Hematology and Oncology

## 2021-06-23 ENCOUNTER — Other Ambulatory Visit: Payer: Self-pay

## 2021-06-23 ENCOUNTER — Encounter: Payer: Self-pay | Admitting: Hematology and Oncology

## 2021-06-23 VITALS — BP 135/74 | HR 69 | Temp 96.8°F | Resp 17 | Wt 168.9 lb

## 2021-06-23 DIAGNOSIS — M8580 Other specified disorders of bone density and structure, unspecified site: Secondary | ICD-10-CM | POA: Diagnosis not present

## 2021-06-23 DIAGNOSIS — C50412 Malignant neoplasm of upper-outer quadrant of left female breast: Secondary | ICD-10-CM | POA: Diagnosis not present

## 2021-06-23 DIAGNOSIS — Z170 Estrogen receptor positive status [ER+]: Secondary | ICD-10-CM | POA: Diagnosis not present

## 2021-06-23 DIAGNOSIS — Z79899 Other long term (current) drug therapy: Secondary | ICD-10-CM | POA: Diagnosis not present

## 2021-06-23 DIAGNOSIS — Z923 Personal history of irradiation: Secondary | ICD-10-CM | POA: Insufficient documentation

## 2021-06-23 DIAGNOSIS — R918 Other nonspecific abnormal finding of lung field: Secondary | ICD-10-CM | POA: Diagnosis not present

## 2021-06-23 DIAGNOSIS — E041 Nontoxic single thyroid nodule: Secondary | ICD-10-CM | POA: Insufficient documentation

## 2021-06-23 DIAGNOSIS — Z79811 Long term (current) use of aromatase inhibitors: Secondary | ICD-10-CM | POA: Diagnosis not present

## 2021-06-23 MED ORDER — LETROZOLE 2.5 MG PO TABS: 2.5000 mg | ORAL_TABLET | Freq: Every day | ORAL | 3 refills | Status: DC

## 2021-06-23 NOTE — Progress Notes (Signed)
[place]  [phone]) [phone] [phone]) [phone]    ID: Teresa Knapp DOB: 10-30-1955  MR#: [id number]  YKZ#:[id number]  Patient Care Team: Kathyrn Lass, MD as PCP - General (Family Medicine) Mauro Kaufmann, RN as Oncology Nurse Navigator Rockwell Germany, RN as Oncology Nurse Navigator Erroll Luna, MD as Consulting Physician (General Surgery) Magrinat, Virgie Dad, MD as Consulting Physician (Oncology) Kyung Rudd, MD as Consulting Physician (Radiation Oncology) Benay Pike, MD OTHER MD:  CHIEF COMPLAINT: estrogen receptor positive breast cancer  CURRENT TREATMENT: Antiestrogen therapy   INTERVAL HISTORY:  Teresa Knapp returns today for follow up of her estrogen receptor positive breast cancer. She was evaluated in the multidisciplinary breast cancer clinic on 02/16/2021.  She completed adjuvant radiation and is here to discuss anti estrogen therapy. She has healed well, had some skin peeling after radiation but improving.  Of note, she also underwent thyroid aspiration on 03/15/2021. Cytology from the procedure ([id number]) showed: scant follicular epithelium present.  She has not quite made a decision about starting antiestrogen therapy.  Dr. Jana Hakim had detailed discussion about tamoxifen versus aromatase inhibitors and gave her some printed information as well.  Rest of the pertinent 10 point ROS reviewed and negative   COVID 19 VACCINATION STATUS: Pfizer x4, most recently 10/2020   HISTORY OF CURRENT ILLNESS: From the original intake note:  "Teresa Knapp" had routine screening mammography on 01/13/2021 showing a possible abnormality in the left breast. She underwent left diagnostic mammography with tomography and left breast ultrasonography at [facility] on 02/02/2021 showing: breast density category B; 9 mm irregular mass in left breast at 2 o'clock; no enlarged or morphologically abnormal lymph nodes.  Accordingly on 02/08/2021 she  proceeded to biopsy of the left breast area in question. The pathology from this procedure ([id number]) showed: invasive ductal carcinoma with extracellular mucin, grade 2. Prognostic indicators significant for: estrogen receptor, 100% positive and progesterone receptor, 40% positive, both with strong staining intensity. Proliferation marker Ki67 at 15%. HER2 equivocal by immunohistochemistry (2+), but negative by fluorescent in situ hybridization with a signals ratio 1.04 and number per cell 1.35.   Cancer Staging  Malignant neoplasm of upper-outer quadrant of left breast in female, estrogen receptor positive (Teresa Knapp) Staging form: Breast, AJCC 8th Edition - Clinical stage from 02/16/2021: Stage IA (cT1b, cN0, cM0, G2, ER+, PR+, HER2-) - Signed by Chauncey Cruel, MD on 02/16/2021 Stage prefix: Initial diagnosis Method of lymph node assessment: Clinical Histologic grading system: 3 grade system   The patient's subsequent history is as detailed below.   PAST MEDICAL HISTORY: Past Medical History:  Diagnosis Date   Arthritis    Cancer ([place])    Breast cancer   Family history of adverse reaction to anesthesia    mother had a possible reaction to anesthesia (possible jerking) not sure    PAST SURGICAL HISTORY: Past Surgical History:  Procedure Laterality Date   BIOPSY THYROID N/A    BREAST LUMPECTOMY WITH RADIOACTIVE SEED AND SENTINEL LYMPH NODE BIOPSY Left 03/10/2021   Procedure: LEFT BREAST LUMPECTOMY WITH RADIOACTIVE SEED AND SENTINEL LYMPH NODE BIOPSY;  Surgeon: Erroll Luna, MD;  Location: [facility];  Service: General;  Laterality: Left;   [place] N/A 03/03/2019   Procedure: LAPAROSCOPIC CHOLECYSTECTOMY WITH INTRAOPERATIVE CHOLANGIOGRAM;  Surgeon: Armandina Gemma, MD;  Location: [facility];  Service: General;  Laterality: N/A;   lipoma removal     right side of neck   UMBILICAL HERNIA REPAIR  N/A 03/03/2019   Procedure: PRIMARY REPAIR OF UMBILICAL  HERNIA;  Surgeon: Armandina Gemma, MD;  Location: [facility];  Service: General;  Laterality: N/A;    FAMILY HISTORY: Family History  Problem Relation Age of Onset   Cancer Mother        lung   Hypertension Mother    Heart attack Father    Breast cancer Maternal Grandmother    Diabetes Maternal Grandmother    Hypertension Maternal Grandmother    Her father died around age 32 from MI. Her mother died at age 58 from lung cancer. She was diagnosed at age 31 and was a tobacco user. Jahnessa had one brother and one sister, both are deceased. In addition to her mother, she reports breast cancer in her maternal grandmother.   GYNECOLOGIC HISTORY:  No LMP recorded. Patient is postmenopausal. Menarche: 66 years old Age at first live birth: 66 years old [place] P 2 LMP 09/2004 Contraceptive: used for 12 years HRT never used  Hysterectomy? no BSO? no   SOCIAL HISTORY: (updated 01/2021)  Salimah works as a Proofreader. Husband Mortimer Fries is retired from working in [employer] for [employer]. Daughter Judeen Hammans, age 65, works in Investment banker, corporate in [place], [place]. Son Hilliard Clark, age 102, is a Marine stationed in [place]; he is in his 2nd of 4 years in [place]. Demetris has three step-grandchildren. She attends a [employer], therapeutic church.    ADVANCED DIRECTIVES: In the absence of any documentation to the contrary, the patient's spouse is their HCPOA.    HEALTH MAINTENANCE: Social History   Tobacco Use   Smoking status: Never   Smokeless tobacco: Never  Vaping Use   Vaping Use: Never used  Substance Use Topics   Alcohol use: Yes    Alcohol/week: 1.0 standard drink    Types: 1 Glasses of wine per week    Comment: SOCIAL   Drug use: Never     Colonoscopy: 02/2015  PAP: 08/2018, negative  Bone density: 11/2018, -1.7   No Known Allergies  Current Outpatient Medications  Medication Sig Dispense Refill   ergocalciferol (VITAMIN D2) 1.25 MG (50000 UT) capsule Take 50,000 Units by mouth once a week.     ibuprofen  (ADVIL) 800 MG tablet Take 1 tablet (800 mg total) by mouth every 8 (eight) hours as needed. 30 tablet 0   rosuvastatin (CRESTOR) 10 MG tablet Take 10 mg by mouth daily.     No current facility-administered medications for this visit.    OBJECTIVE: African-American woman who appears younger than stated age  There were no vitals filed for this visit.    There is no height or weight on file to calculate BMI.   Wt Readings from Last 3 Encounters:  04/19/21 174 lb 9.6 oz (79.2 kg)  04/14/21 173 lb 8 oz (78.7 kg)  04/11/21 175 lb (79.4 kg)      ECOG FS:1 - Symptomatic but completely ambulatory  Sclerae unicteric, EOMs intact Wearing a mask No cervical or supraclavicular adenopathy Lungs no rales or rhonchi Heart regular rate and rhythm Abd soft, nontender, positive bowel sounds MSK no focal spinal tenderness, no upper extremity lymphedema Neuro: nonfocal, well oriented, appropriate affect Breasts: Left breast with postradiation changes, some skin skin desquamation noted.  No ulcers or evidence of infection   LAB RESULTS:  CMP     Component Value Date/Time   Knapp 138 03/01/2021 1550   K 4.0 03/01/2021 1550   CL 105 03/01/2021 1550   CO2  25 03/01/2021 1550   GLUCOSE 103 (H) 03/01/2021 1550   BUN 12 03/01/2021 1550   CREATININE 0.79 03/01/2021 1550   CREATININE 0.74 02/16/2021 0835   CALCIUM 9.3 03/01/2021 1550   PROT 6.8 03/01/2021 1550   ALBUMIN 3.6 03/01/2021 1550   AST 18 03/01/2021 1550   AST 17 02/16/2021 0835   ALT 17 03/01/2021 1550   ALT 13 02/16/2021 0835   ALKPHOS 67 03/01/2021 1550   BILITOT 0.8 03/01/2021 1550   BILITOT 0.8 02/16/2021 0835   GFRNONAA >60 03/01/2021 1550   GFRNONAA >60 02/16/2021 0835   GFRAA >60 02/27/2019 0841    No results found for: TOTALPROTELP, ALBUMINELP, A1GS, A2GS, BETS, BETA2SER, GAMS, MSPIKE, SPEI  Lab Results  Component Value Date   WBC 9.4 03/01/2021   NEUTROABS 6.2 03/01/2021   HGB 12.9 03/01/2021   HCT 39.3 03/01/2021    MCV 92.7 03/01/2021   PLT 249 03/01/2021    No results found for: LABCA2  No components found for: OINOMV672  No results for input(s): INR in the last 168 hours.  No results found for: LABCA2  No results found for: CNO709  No results found for: GGE366  No results found for: QHU765  No results found for: CA2729  No components found for: HGQUANT  No results found for: CEA1 / No results found for: CEA1   No results found for: AFPTUMOR  No results found for: CHROMOGRNA  No results found for: KPAFRELGTCHN, LAMBDASER, KAPLAMBRATIO (kappa/lambda light chains)  No results found for: HGBA, HGBA2QUANT, HGBFQUANT, HGBSQUAN (Hemoglobinopathy evaluation)   No results found for: LDH  No results found for: IRON, TIBC, IRONPCTSAT (Iron and TIBC)  No results found for: FERRITIN  Urinalysis    Component Value Date/Time   COLORURINE YELLOW 01/29/2019 2210   APPEARANCEUR HAZY (A) 01/29/2019 2210   LABSPEC 1.014 01/29/2019 2210   PHURINE 6.0 01/29/2019 2210   GLUCOSEU NEGATIVE 01/29/2019 2210   HGBUR NEGATIVE 01/29/2019 2210   BILIRUBINUR NEGATIVE 01/29/2019 2210   KETONESUR 20 (A) 01/29/2019 2210   PROTEINUR NEGATIVE 01/29/2019 2210   NITRITE NEGATIVE 01/29/2019 2210   LEUKOCYTESUR LARGE (A) 01/29/2019 2210    STUDIES: No results found.   ELIGIBLE FOR AVAILABLE RESEARCH PROTOCOL: no  ASSESSMENT: 66 y.o. [place] woman status post left breast upper outer quadrant biopsy 02/08/2021 for a clinical T1b N0, stage IA invasive ductal carcinoma, grade 2, estrogen and progesterone receptor positive, HER2 not amplified, with an MIB-1 of 15%.  (1) status post left breast upper outer quadrant lumpectomy 03/10/2021 for a pT1c pN0, stage IA invasive ductal carcinoma, grade 2, with negative margins.  (a) a total of 3 left axillary lymph nodes were removed  (2) Oncotype score of 12 predicts a risk of recurrence outside the breast over the next 9 years of 3% if the patient only  systemic therapy is antiestrogens for 5 years.  It also predicts no benefit from adjuvant chemotherapy.  (3) adjuvant radiation i 05/02/2021 through 05/31/2021  (4) antiestrogens to start at the end of local treatment  (5) pulmonary nodule 0.2 cm incidentally noted on 08/24/2020 scan  (a) stable on repeat noncontrast CT of the chest 12/02/2020  (6) indeterminate thyroid nodule biopsied 01/20/2021 showed scant follicular epithelium ([place] 1)  (A) repeat biopsy 03/15/2021 showed scant follicular epithelium   PLAN:  Ms Teresa Knapp completed adjuvant radiation. She had some adverse effect from radiation such as a small area of wound dehiscence which healed well. She is still not decided about the  type of antiestrogen therapy. We have once again discussed about mechanism of action of tamoxifen vs aromatase inhibitors, adverse effects with each class. At the end of discussion, we agreed to try letrozole. Medication dispensed to the pharmacy of her choice. She will have another mammogram in September 2023.  Advised calcium, vitamin D supplementation, weightbearing exercises.  She had baseline bone density scan which showed osteopenia, will plan to repeat it in December 2024.  Total encounter time 18mnutes.*  *Total Encounter Time as defined by the Centers for Medicare and Medicaid Services includes, in addition to the face-to-face time of a patient visit (documented in the note above) non-face-to-face time: obtaining and reviewing outside history, ordering and reviewing medications, tests or procedures, care coordination (communications with other health care professionals or caregivers) and documentation in the medical record.

## 2021-06-30 NOTE — Progress Notes (Signed)
°  Radiation Oncology         [phone] ________________________________  Name: Teresa Knapp MRN: [id number]  Date of Service: 07/04/2021  DOB: 02/08/1956  Post Treatment Telephone Note  Diagnosis:   Stage IA, pT1cN0M0 grade 2, ER/PR positive invasive ductal carcinoma of the left breast.  Intent: Curative  Radiation Treatment Dates: 05/02/2021 through 05/31/2021 Site Technique Total Dose (Gy) Dose per Fx (Gy) Completed Fx Beam Energies  Breast, Left: Breast_L 3D 42.56/42.56 2.66 16/16 10XFFF  Breast, Left: Breast_L_Bst 3D 8/8 2 4/4 6X, 10X   Narrative: The patient tolerated radiation therapy relatively well. She developed anticipated skin changes in the treatment field.    Impression/Plan: 1. Stage IA, pT1cN0M0 grade 2, ER/PR positive invasive ductal carcinoma of the left breast.I was unable to reach the patient but left a voicemail and on the message, I discussed that we would be happy to continue to follow her as needed, but she will also continue to follow up with Dr. Chryl Heck in medical oncology. She was counseled to call if she has questions or concerns.     Carola Rhine, PAC

## 2021-07-04 ENCOUNTER — Ambulatory Visit
Admission: RE | Admit: 2021-07-04 | Discharge: 2021-07-04 | Disposition: A | Discharge status: Home / Self Care | Payer: Medicare HMO | Source: Ambulatory Visit | Attending: Hematology and Oncology | Admitting: Hematology and Oncology

## 2021-07-04 ENCOUNTER — Telehealth: Payer: Self-pay | Admitting: *Deleted

## 2021-07-04 DIAGNOSIS — C50412 Malignant neoplasm of upper-outer quadrant of left female breast: Secondary | ICD-10-CM

## 2021-07-04 DIAGNOSIS — Z170 Estrogen receptor positive status [ER+]: Secondary | ICD-10-CM

## 2021-07-07 ENCOUNTER — Telehealth: Payer: Self-pay | Admitting: *Deleted

## 2021-07-21 ENCOUNTER — Encounter (HOSPITAL_COMMUNITY): Payer: Self-pay

## 2021-08-12 ENCOUNTER — Telehealth: Payer: Self-pay | Admitting: Adult Health

## 2021-08-12 ENCOUNTER — Ambulatory Visit: Payer: Medicare HMO | Admitting: Adult Health

## 2021-08-12 ENCOUNTER — Other Ambulatory Visit: Payer: Self-pay | Admitting: Adult Health

## 2021-08-12 ENCOUNTER — Telehealth: Payer: Self-pay | Admitting: *Deleted

## 2021-08-12 DIAGNOSIS — C50412 Malignant neoplasm of upper-outer quadrant of left female breast: Secondary | ICD-10-CM

## 2021-08-12 NOTE — Telephone Encounter (Signed)
Rescheduled appointment per 3/17 secure chat. Left message. ?

## 2021-08-18 ENCOUNTER — Encounter: Payer: Self-pay | Admitting: Adult Health

## 2021-08-18 ENCOUNTER — Ambulatory Visit: Payer: Medicare HMO | Admitting: Hematology and Oncology

## 2021-08-18 ENCOUNTER — Inpatient Hospital Stay: Payer: Medicare HMO | Attending: Oncology | Admitting: Adult Health

## 2021-08-18 ENCOUNTER — Other Ambulatory Visit: Payer: Self-pay

## 2021-08-18 VITALS — BP 149/83 | HR 62 | Temp 97.5°F | Resp 16 | Ht 65.0 in | Wt 165.4 lb

## 2021-08-18 DIAGNOSIS — C50412 Malignant neoplasm of upper-outer quadrant of left female breast: Secondary | ICD-10-CM | POA: Diagnosis not present

## 2021-08-18 DIAGNOSIS — M85852 Other specified disorders of bone density and structure, left thigh: Secondary | ICD-10-CM | POA: Insufficient documentation

## 2021-08-18 DIAGNOSIS — D1771 Benign lipomatous neoplasm of kidney: Secondary | ICD-10-CM | POA: Insufficient documentation

## 2021-08-18 DIAGNOSIS — K760 Fatty (change of) liver, not elsewhere classified: Secondary | ICD-10-CM | POA: Insufficient documentation

## 2021-08-18 DIAGNOSIS — R911 Solitary pulmonary nodule: Secondary | ICD-10-CM | POA: Insufficient documentation

## 2021-08-18 DIAGNOSIS — E041 Nontoxic single thyroid nodule: Secondary | ICD-10-CM | POA: Insufficient documentation

## 2021-08-18 DIAGNOSIS — Z79811 Long term (current) use of aromatase inhibitors: Secondary | ICD-10-CM | POA: Diagnosis not present

## 2021-08-18 DIAGNOSIS — K5730 Diverticulosis of large intestine without perforation or abscess without bleeding: Secondary | ICD-10-CM | POA: Insufficient documentation

## 2021-08-18 DIAGNOSIS — Z170 Estrogen receptor positive status [ER+]: Secondary | ICD-10-CM | POA: Diagnosis not present

## 2021-08-18 DIAGNOSIS — E7800 Pure hypercholesterolemia, unspecified: Secondary | ICD-10-CM | POA: Insufficient documentation

## 2021-08-18 DIAGNOSIS — Z79899 Other long term (current) drug therapy: Secondary | ICD-10-CM | POA: Diagnosis not present

## 2021-08-18 DIAGNOSIS — E559 Vitamin D deficiency, unspecified: Secondary | ICD-10-CM | POA: Insufficient documentation

## 2021-08-18 DIAGNOSIS — R7303 Prediabetes: Secondary | ICD-10-CM | POA: Insufficient documentation

## 2021-08-18 DIAGNOSIS — M8580 Other specified disorders of bone density and structure, unspecified site: Secondary | ICD-10-CM | POA: Insufficient documentation

## 2021-08-18 NOTE — Progress Notes (Signed)
SURVIVORSHIP VISIT: ? ?BRIEF ONCOLOGIC HISTORY:  ?Oncology History  ?Malignant neoplasm of upper-outer quadrant of left breast in female, estrogen receptor positive ([place])  ?02/14/2021 Initial Diagnosis  ? Malignant neoplasm of upper-outer quadrant of left breast in female, estrogen receptor positive ([place]) ?  ?02/16/2021 Cancer Staging  ? Staging form: Breast, AJCC 8th Edition ?- Clinical stage from 02/16/2021: Stage IA (cT1b, cN0, cM0, G2, ER+, PR+, HER2-) - Signed by Chauncey Cruel, MD on 02/16/2021 ?Stage prefix: Initial diagnosis ?Method of lymph node assessment: Clinical ?Histologic grading system: 3 grade system ? ?  ?03/10/2021 Surgery  ? Left Breast Lumpectomy with sentinel lymph node mapping. ?  ?05/02/2021 - 05/31/2021 Radiation Therapy  ? Radiation Treatment Dates: 05/02/2021 through 05/31/2021 ?Site Technique Total Dose (Gy) Dose per Fx (Gy) Completed Fx Beam Energies  ?Breast, Left: Breast_L 3D 42.56/42.56 2.66 16/16 10XFFF  ?Breast, Left: Breast_L_Bst 3D 8/8 2 4/4 6X, 10X  ?  ?05/2021 -  Anti-estrogen oral therapy  ?  Letrozole daily ?  ? ? ?INTERVAL HISTORY:  ?[name] to review her survivorship care plan detailing her treatment course for breast cancer, as well as monitoring long-term side effects of that treatment, education regarding health maintenance, screening, and overall wellness and health promotion.    ? ?Overall, [name] reports feeling quite well.  She is taking Letrozole daily and is tolerating it moderately well.  She experiences hot flasehs and night sweats.   ? ?REVIEW OF SYSTEMS:  ?Review of Systems  ?Constitutional:  Negative for appetite change, chills, fatigue, fever and unexpected weight change.  ?HENT:   Negative for hearing loss, lump/mass and trouble swallowing.   ?Eyes:  Negative for eye problems and icterus.  ?Respiratory:  Negative for chest tightness, cough and shortness of breath.   ?Cardiovascular:  Negative for chest pain, leg swelling and palpitations.   ?Gastrointestinal:  Negative for abdominal distention, abdominal pain, constipation, diarrhea, nausea and vomiting.  ?Endocrine: Positive for hot flashes.  ?Genitourinary:  Negative for difficulty urinating.   ?Musculoskeletal:  Negative for arthralgias.  ?Skin:  Negative for itching and rash.  ?Neurological:  Negative for dizziness, extremity weakness, headaches and numbness.  ?Hematological:  Negative for adenopathy. Does not bruise/bleed easily.  ?Psychiatric/Behavioral:  Negative for depression. The patient is not nervous/anxious.   ?Breast: Denies any new nodularity, masses, tenderness, nipple changes, or nipple discharge.  ? ? ? ? ?ONCOLOGY TREATMENT TEAM:  ?1. Surgeon:  Dr. Brantley Stage at [facility] ?2. Medical Oncologist: Dr. Chryl Heck  ?3. Radiation Oncologist: Dr. Lisbeth Renshaw ?  ? ?PAST MEDICAL/SURGICAL HISTORY:  ?Past Medical History:  ?Diagnosis Date  ? Arthritis   ? Cancer [facility])   ? Breast cancer  ? Family history of adverse reaction to anesthesia   ? mother had a possible reaction to anesthesia (possible jerking) not sure  ? ?Past Surgical History:  ?Procedure Laterality Date  ? BIOPSY THYROID N/A   ? BREAST LUMPECTOMY WITH RADIOACTIVE SEED AND SENTINEL LYMPH NODE BIOPSY Left 03/10/2021  ? Procedure: LEFT BREAST LUMPECTOMY WITH RADIOACTIVE SEED AND SENTINEL LYMPH NODE BIOPSY;  Surgeon: Erroll Luna, MD;  Location: [facility];  Service: General;  Laterality: Left;  ? [place]  ? CHOLECYSTECTOMY N/A 03/03/2019  ? Procedure: LAPAROSCOPIC CHOLECYSTECTOMY WITH INTRAOPERATIVE CHOLANGIOGRAM;  Surgeon: Armandina Gemma, MD;  Location[facility];  Service: General;  Laterality: N/A;  ? lipoma removal    ? right side of neck  ? UMBILICAL HERNIA REPAIR N/A 03/03/2019  ?  Procedure: PRIMARY REPAIR OF UMBILICAL HERNIA;  Surgeon: Armandina Gemma, MD;  Location: [facility];  Service: General;  Laterality: N/A;  ? ? ? ?ALLERGIES:  ?No Known Allergies ? ? ?CURRENT MEDICATIONS:  ?Outpatient Encounter Medications  as of 08/18/2021  ?Medication Sig  ? letrozole (FEMARA) 2.5 MG tablet Take 1 tablet (2.5 mg total) by mouth daily.  ? rosuvastatin (CRESTOR) 10 MG tablet Take 10 mg by mouth daily.  ? ergocalciferol (VITAMIN D2) 1.25 MG (50000 UT) capsule Take 50,000 Units by mouth once a week.  ? ?No facility-administered encounter medications on file as of 08/18/2021.  ? ? ? ?ONCOLOGIC FAMILY HISTORY:  ?Family History  ?Problem Relation Age of Onset  ? Cancer Mother   ?     lung  ? Hypertension Mother   ? Heart attack Father   ? Breast cancer Maternal Grandmother   ? Diabetes Maternal Grandmother   ? Hypertension Maternal Grandmother   ? ? ? ?GENETIC COUNSELING/TESTING: ?Not at this time.   ? ?SOCIAL HISTORY:  ?Social History  ? ?Socioeconomic History  ? Marital status: Married  ?  Spouse name: Not on file  ? Number of children: Not on file  ? Years of education: Not on file  ? Highest education level: Not on file  ?Occupational History  ? Not on file  ?Tobacco Use  ? Smoking status: Never  ? Smokeless tobacco: Never  ?Vaping Use  ? Vaping Use: Never used  ?Substance and Sexual Activity  ? Alcohol use: Yes  ?  Alcohol/week: 1.0 standard drink  ?  Types: 1 Glasses of wine per week  ?  Comment: SOCIAL  ? Drug use: Never  ? Sexual activity: Yes  ?  Partners: Male  ?  Birth control/protection: Post-menopausal  ?  Comment: 1st intercourse- 5, partners-3, married- 46 yrs   ?Other Topics Concern  ? Not on file  ?Social History Narrative  ? Not on file  ? ?Social Determinants of Health  ? ?Financial Resource Strain: Low Risk   ? Difficulty of Paying Living Expenses: Not very hard  ?Food Insecurity: No Food Insecurity  ? Worried About Charity fundraiser in the Last Year: Never true  ? Ran Out of Food in the Last Year: Never true  ?Transportation Needs: No Transportation Needs  ? Lack of Transportation (Medical): No  ? Lack of Transportation (Non-Medical): No  ?Physical Activity: Not on file  ?Stress: Not on file  ?Social Connections: Not  on file  ?Intimate Partner Violence: Not on file  ? ? ? ?OBSERVATIONS/OBJECTIVE:  ?BP (!) 149/83 (BP Location: Left Arm, Patient Position: Sitting)   Pulse 62   Temp (!) 97.5 ?F (36.4 ?C) (Temporal)   Resp 16   Ht _0  (1.651 m)   Wt 165 lb 6.4 oz (75 kg)   SpO2 95%   BMI 27.52 kg/m?  ?GENERAL: Patient is a well appearing female in no acute distress ?HEENT:  Sclerae anicteric.  Oropharynx clear and moist. No ulcerations or evidence of oropharyngeal candidiasis. Neck is supple.  ?NODES:  No cervical, supraclavicular, or axillary lymphadenopathy palpated.  ?BREAST EXAM:  Deferred. ?LUNGS:  Clear to auscultation bilaterally.  No wheezes or rhonchi. ?HEART:  Regular rate and rhythm. No murmur appreciated. ?ABDOMEN:  Soft, nontender.  Positive, normoactive bowel sounds. No organomegaly palpated. ?MSK:  No focal spinal tenderness to palpation. Full range of motion bilaterally in the upper extremities. ?EXTREMITIES:  No peripheral edema.   ?SKIN:  Clear with no obvious  rashes or skin changes. No nail dyscrasia. ?NEURO:  Nonfocal. Well oriented.  Appropriate affect. ? ? ?LABORATORY DATA:  ?None for this visit. ? ?DIAGNOSTIC IMAGING:  ?None for this visit.  ? ?  ? ?ASSESSMENT AND PLAN:  ?Ms.Teresa Knapp is a pleasant 66 y.o. female with Stage IA left breast invasive ductal carcinoma, ER+/PR+/HER2-, diagnosed in 01/2021, treated with lumpectomy, adjuvant radiation therapy, and anti-estrogen therapy with Letrozole beginning in 05/2021.  She presents to the Survivorship Clinic for our initial meeting and routine follow-up post-completion of treatment for breast cancer.  ? ? ?1. Stage IA left breast cancer:  [name] is continuing to recover from definitive treatment for breast cancer. She will follow-up with her medical oncologist, Dr. Lindi Adie in 6 months with history and physical exam per surveillance protocol.  She will continue her anti-estrogen therapy with Letrozole. Thus far, she is tolerating the Letrozole  well, with minimal side effects. She was instructed to make Dr. Lindi Adie or myself aware if she begins to experience any worsening side effects of the medication and I could see her back in clinic to help manage those sid

## 2021-08-22 DIAGNOSIS — C50412 Malignant neoplasm of upper-outer quadrant of left female breast: Secondary | ICD-10-CM | POA: Diagnosis not present

## 2021-08-22 DIAGNOSIS — E041 Nontoxic single thyroid nodule: Secondary | ICD-10-CM | POA: Diagnosis not present

## 2021-08-22 DIAGNOSIS — I2510 Atherosclerotic heart disease of native coronary artery without angina pectoris: Secondary | ICD-10-CM | POA: Diagnosis not present

## 2021-08-22 DIAGNOSIS — M8580 Other specified disorders of bone density and structure, unspecified site: Secondary | ICD-10-CM | POA: Diagnosis not present

## 2021-08-22 DIAGNOSIS — R911 Solitary pulmonary nodule: Secondary | ICD-10-CM | POA: Diagnosis not present

## 2021-08-22 DIAGNOSIS — E663 Overweight: Secondary | ICD-10-CM | POA: Diagnosis not present

## 2021-08-22 DIAGNOSIS — I700 Atherosclerosis of aorta: Secondary | ICD-10-CM | POA: Diagnosis not present

## 2021-08-22 DIAGNOSIS — E559 Vitamin D deficiency, unspecified: Secondary | ICD-10-CM | POA: Diagnosis not present

## 2021-08-22 DIAGNOSIS — E7800 Pure hypercholesterolemia, unspecified: Secondary | ICD-10-CM | POA: Diagnosis not present

## 2021-08-22 DIAGNOSIS — Z0000 Encounter for general adult medical examination without abnormal findings: Secondary | ICD-10-CM | POA: Diagnosis not present

## 2021-08-22 DIAGNOSIS — Z79899 Other long term (current) drug therapy: Secondary | ICD-10-CM | POA: Diagnosis not present

## 2021-09-12 ENCOUNTER — Ambulatory Visit
Admission: RE | Admit: 2021-09-12 | Discharge: 2021-09-12 | Disposition: A | Discharge status: Home / Self Care | Payer: Medicare HMO | Source: Ambulatory Visit | Attending: Adult Health | Admitting: Adult Health

## 2021-09-12 DIAGNOSIS — Z170 Estrogen receptor positive status [ER+]: Secondary | ICD-10-CM

## 2021-09-12 DIAGNOSIS — C50412 Malignant neoplasm of upper-outer quadrant of left female breast: Secondary | ICD-10-CM

## 2021-10-16 ENCOUNTER — Other Ambulatory Visit: Payer: Self-pay | Admitting: Hematology and Oncology

## 2021-11-28 ENCOUNTER — Other Ambulatory Visit: Payer: Self-pay | Admitting: Family Medicine

## 2021-11-28 DIAGNOSIS — R911 Solitary pulmonary nodule: Secondary | ICD-10-CM

## 2021-12-05 DIAGNOSIS — E559 Vitamin D deficiency, unspecified: Secondary | ICD-10-CM | POA: Diagnosis not present

## 2021-12-23 DIAGNOSIS — H5203 Hypermetropia, bilateral: Secondary | ICD-10-CM | POA: Diagnosis not present

## 2021-12-23 DIAGNOSIS — H2513 Age-related nuclear cataract, bilateral: Secondary | ICD-10-CM | POA: Diagnosis not present

## 2021-12-23 DIAGNOSIS — H524 Presbyopia: Secondary | ICD-10-CM | POA: Diagnosis not present

## 2022-01-02 ENCOUNTER — Other Ambulatory Visit: Payer: Self-pay | Admitting: Family Medicine

## 2022-01-02 ENCOUNTER — Other Ambulatory Visit: Payer: Self-pay | Admitting: *Deleted

## 2022-01-02 DIAGNOSIS — E042 Nontoxic multinodular goiter: Secondary | ICD-10-CM

## 2022-01-04 ENCOUNTER — Ambulatory Visit
Admission: RE | Admit: 2022-01-04 | Discharge: 2022-01-04 | Disposition: A | Discharge status: Home / Self Care | Payer: Medicare HMO | Source: Ambulatory Visit | Attending: Family Medicine | Admitting: Family Medicine

## 2022-01-04 DIAGNOSIS — R911 Solitary pulmonary nodule: Secondary | ICD-10-CM | POA: Diagnosis not present

## 2022-01-05 ENCOUNTER — Ambulatory Visit
Admission: RE | Admit: 2022-01-05 | Discharge: 2022-01-05 | Disposition: A | Discharge status: Home / Self Care | Payer: Medicare HMO | Source: Ambulatory Visit | Attending: *Deleted | Admitting: *Deleted

## 2022-01-05 DIAGNOSIS — E042 Nontoxic multinodular goiter: Secondary | ICD-10-CM | POA: Diagnosis not present

## 2022-01-06 DIAGNOSIS — E042 Nontoxic multinodular goiter: Secondary | ICD-10-CM | POA: Diagnosis not present

## 2022-01-06 DIAGNOSIS — R7303 Prediabetes: Secondary | ICD-10-CM | POA: Diagnosis not present

## 2022-01-06 DIAGNOSIS — E041 Nontoxic single thyroid nodule: Secondary | ICD-10-CM | POA: Diagnosis not present

## 2022-01-10 DIAGNOSIS — E042 Nontoxic multinodular goiter: Secondary | ICD-10-CM | POA: Diagnosis not present

## 2022-01-10 DIAGNOSIS — R7303 Prediabetes: Secondary | ICD-10-CM | POA: Diagnosis not present

## 2022-01-16 ENCOUNTER — Ambulatory Visit
Admission: RE | Admit: 2022-01-16 | Discharge: 2022-01-16 | Disposition: A | Discharge status: Home / Self Care | Payer: Medicare HMO | Source: Ambulatory Visit | Attending: Adult Health | Admitting: Adult Health

## 2022-01-16 DIAGNOSIS — R928 Other abnormal and inconclusive findings on diagnostic imaging of breast: Secondary | ICD-10-CM | POA: Diagnosis not present

## 2022-01-16 DIAGNOSIS — Z853 Personal history of malignant neoplasm of breast: Secondary | ICD-10-CM | POA: Diagnosis not present

## 2022-01-16 HISTORY — DX: Personal history of irradiation: Z92.3

## 2022-02-10 ENCOUNTER — Other Ambulatory Visit: Payer: Self-pay | Admitting: Hematology and Oncology

## 2022-02-17 ENCOUNTER — Ambulatory Visit: Payer: Medicare HMO | Admitting: Hematology and Oncology

## 2022-02-21 ENCOUNTER — Other Ambulatory Visit: Payer: Self-pay

## 2022-02-21 ENCOUNTER — Inpatient Hospital Stay: Payer: Medicare HMO | Attending: Hematology and Oncology | Admitting: Hematology and Oncology

## 2022-02-21 ENCOUNTER — Encounter: Payer: Self-pay | Admitting: Hematology and Oncology

## 2022-02-21 VITALS — BP 125/66 | HR 60 | Temp 97.7°F | Resp 16 | Ht 65.0 in | Wt 170.0 lb

## 2022-02-21 DIAGNOSIS — C50412 Malignant neoplasm of upper-outer quadrant of left female breast: Secondary | ICD-10-CM | POA: Insufficient documentation

## 2022-02-21 DIAGNOSIS — Z923 Personal history of irradiation: Secondary | ICD-10-CM | POA: Insufficient documentation

## 2022-02-21 DIAGNOSIS — Z170 Estrogen receptor positive status [ER+]: Secondary | ICD-10-CM | POA: Diagnosis not present

## 2022-02-21 DIAGNOSIS — Z79811 Long term (current) use of aromatase inhibitors: Secondary | ICD-10-CM | POA: Insufficient documentation

## 2022-02-21 DIAGNOSIS — R918 Other nonspecific abnormal finding of lung field: Secondary | ICD-10-CM | POA: Diagnosis not present

## 2022-02-21 NOTE — Progress Notes (Signed)
SURVIVORSHIP VISIT:  BRIEF ONCOLOGIC HISTORY:  Oncology History  Malignant neoplasm of upper-outer quadrant of left breast in female, estrogen receptor positive ([place])  02/14/2021 Initial Diagnosis   Malignant neoplasm of upper-outer quadrant of left breast in female, estrogen receptor positive ([place])   02/16/2021 Cancer Staging   Staging form: Breast, AJCC 8th Edition - Clinical stage from 02/16/2021: Stage IA (cT1b, cN0, cM0, G2, ER+, PR+, HER2-) - Signed by Chauncey Cruel, MD on 02/16/2021 Stage prefix: Initial diagnosis Method of lymph node assessment: Clinical Histologic grading system: 3 grade system   03/10/2021 Surgery   Left Breast Lumpectomy with sentinel lymph node mapping.   05/02/2021 - 05/31/2021 Radiation Therapy   Radiation Treatment Dates: 05/02/2021 through 05/31/2021 Site Technique Total Dose (Gy) Dose per Fx (Gy) Completed Fx Beam Energies  Breast, Left: Breast_L 3D 42.56/42.56 2.66 16/16 10XFFF  Breast, Left: Breast_L_Bst 3D 8/8 2 4/4 6X, 10X     05/2021 -  Anti-estrogen oral therapy    Letrozole daily     INTERVAL HISTORY:   [name] is here for follow-up.  She has been taking letrozole as prescribed.  She has been tolerating it very well.  She has noticed some hair thinning, otherwise denies any complaints.  She is also exercising regularly about 3-4 times in the gym.  She denies any changes in her breast.  Rest of the pertinent 10 point ROS reviewed and negative  REVIEW OF SYSTEMS:  Review of Systems  Constitutional:  Negative for appetite change, chills, fatigue, fever and unexpected weight change.  HENT:   Negative for hearing loss, lump/mass and trouble swallowing.   Eyes:  Negative for eye problems and icterus.  Respiratory:  Negative for chest tightness, cough and shortness of breath.   Cardiovascular:  Negative for chest pain, leg swelling and palpitations.  Gastrointestinal:  Negative for abdominal distention, abdominal pain, constipation, diarrhea,  nausea and vomiting.  Endocrine: Positive for hot flashes.  Genitourinary:  Negative for difficulty urinating.   Musculoskeletal:  Negative for arthralgias.  Skin:  Negative for itching and rash.  Neurological:  Negative for dizziness, extremity weakness, headaches and numbness.  Hematological:  Negative for adenopathy. Does not bruise/bleed easily.  Psychiatric/Behavioral:  Negative for depression. The patient is not nervous/anxious.    Breast: Denies any new nodularity, masses, tenderness, nipple changes, or nipple discharge.    ONCOLOGY TREATMENT TEAM:  1. Surgeon:  Dr. Brantley Stage at [facility] 2. Medical Oncologist: Dr. Chryl Heck  3. Radiation Oncologist: Dr. Lisbeth Renshaw    PAST MEDICAL/SURGICAL HISTORY:  Past Medical History:  Diagnosis Date   Arthritis    Cancer [facility])    Breast cancer   Family history of adverse reaction to anesthesia    mother had a possible reaction to anesthesia (possible jerking) not sure   Personal history of radiation therapy    Past Surgical History:  Procedure Laterality Date   BIOPSY THYROID N/A    BREAST BIOPSY Left 02/08/2021   BREAST LUMPECTOMY Left 03/09/2021   BREAST LUMPECTOMY WITH RADIOACTIVE SEED AND SENTINEL LYMPH NODE BIOPSY Left 03/10/2021   Procedure: LEFT BREAST LUMPECTOMY WITH RADIOACTIVE SEED AND SENTINEL LYMPH NODE BIOPSY;  Surgeon: Erroll Luna, MD;  Location: [place];  Service: General;  Laterality: Left;   [place] and [place] N/A 03/03/2019   Procedure: LAPAROSCOPIC CHOLECYSTECTOMY WITH INTRAOPERATIVE CHOLANGIOGRAM;  Surgeon: Armandina Gemma, MD;  Location: [facility];  Service: General;  Laterality: N/A;   lipoma removal  right side of neck   UMBILICAL HERNIA REPAIR N/A 03/03/2019   Procedure: PRIMARY REPAIR OF UMBILICAL HERNIA;  Surgeon: Armandina Gemma, MD;  Location: [facility];  Service: General;  Laterality: N/A;     ALLERGIES:  No Known Allergies   CURRENT MEDICATIONS:  Outpatient  Encounter Medications as of 02/21/2022  Medication Sig   letrozole (FEMARA) 2.5 MG tablet TAKE 1 TABLET BY MOUTH EVERY DAY   rosuvastatin (CRESTOR) 10 MG tablet Take 10 mg by mouth daily.   No facility-administered encounter medications on file as of 02/21/2022.     ONCOLOGIC FAMILY HISTORY:  Family History  Problem Relation Age of Onset   Cancer Mother        lung   Hypertension Mother    Heart attack Father    Breast cancer Maternal Grandmother    Diabetes Maternal Grandmother    Hypertension Maternal Grandmother      GENETIC COUNSELING/TESTING: Not at this time.    SOCIAL HISTORY:  Social History   Socioeconomic History   Marital status: Married    Spouse name: Not on file   Number of children: Not on file   Years of education: Not on file   Highest education level: Not on file  Occupational History   Not on file  Tobacco Use   Smoking status: Never   Smokeless tobacco: Never  Vaping Use   Vaping Use: Never used  Substance and Sexual Activity   Alcohol use: Yes    Alcohol/week: 1.0 standard drink of alcohol    Types: 1 Glasses of wine per week    Comment: SOCIAL   Drug use: Never   Sexual activity: Yes    Partners: Male    Birth control/protection: Post-menopausal    Comment: 1st intercourse- 16, partners-3, married- 43 yrs   Other Topics Concern   Not on file  Social History Narrative   Not on file   Social Determinants of Health   Financial Resource Strain: Low Risk  (02/16/2021)   Overall Financial Resource Strain (CARDIA)    Difficulty of Paying Living Expenses: Not very hard  Food Insecurity: No Food Insecurity (02/16/2021)   Hunger Vital Sign    Worried About Running Out of Food in the Last Year: Never true    [place] in the Last Year: Never true  Transportation Needs: No Transportation Needs (02/16/2021)   PRAPARE - Hydrologist (Medical): No    Lack of Transportation (Non-Medical): No  Physical Activity:  Not on file  Stress: Not on file  Social Connections: Not on file  Intimate Partner Violence: Not on file     OBSERVATIONS/OBJECTIVE:  BP 125/66 (BP Location: Left Arm, Patient Position: Sitting)   Pulse 60   Temp 97.7 F (36.5 C) (Temporal)   Resp 16   Ht _0  (1.651 m)   Wt 170 lb (77.1 kg)   SpO2 99%   BMI 28.29 kg/m   Physical Exam Constitutional:      Appearance: Normal appearance.  Chest:     Comments:  Postradiation changes noted in the left breast.  No palpable masses.  No regional adenopathy.  Right breast normal to inspection and palpation. Musculoskeletal:     Cervical back: Normal range of motion and neck supple. No rigidity.  Lymphadenopathy:     Cervical: No cervical adenopathy.  Neurological:     Mental Status: She is alert.     LABORATORY DATA:  None for this visit.  DIAGNOSTIC IMAGING:  None for this visit.    ASSESSMENT AND PLAN:   ASSESSMENT: 66 y.o. [place] woman status post left breast upper outer quadrant biopsy 02/08/2021 for a clinical T1b N0, stage IA invasive ductal carcinoma, grade 2, estrogen and progesterone receptor positive, HER2 not amplified, with an MIB-1 of 15%.   (1) status post left breast upper outer quadrant lumpectomy 03/10/2021 for a pT1c pN0, stage IA invasive ductal carcinoma, grade 2, with negative margins.             (a) a total of 3 left axillary lymph nodes were removed   (2) Oncotype score of 12 predicts a risk of recurrence outside the breast over the next 9 years of 3% if the patient only systemic therapy is antiestrogens for 5 years.  It also predicts no benefit from adjuvant chemotherapy.   (3) adjuvant radiation i 05/02/2021 through 05/31/2021   (4) antiestrogens to start at the end of local treatment   (5) pulmonary nodule 0.2 cm incidentally noted on 08/24/2020 scan             (a) stable on repeat noncontrast CT of the chest 12/02/2020   (6) indeterminate thyroid nodule biopsied 01/20/2021 showed  scant follicular epithelium (Bethesda 1)             (A) repeat biopsy 03/15/2021 showed scant follicular epithelium     PLAN:  She is now here for follow-up on adjuvant letrozole, tolerating this really well.  No concerns at all.  Physical examination today once again posttreatment changes, no concerns.  Last mammogram reviewed, unremarkable.  She will return to clinic in 6 months or sooner as needed.  Self breast exam recommended monthly.  I applauded her for her routine exercise regimen and calcium and vitamin D supplementation as recommended.  She has osteopenia at baseline.  Repeat bone density anticipated in December 2024

## 2022-04-03 DIAGNOSIS — E559 Vitamin D deficiency, unspecified: Secondary | ICD-10-CM | POA: Diagnosis not present

## 2022-04-03 DIAGNOSIS — M8580 Other specified disorders of bone density and structure, unspecified site: Secondary | ICD-10-CM | POA: Diagnosis not present

## 2022-04-03 DIAGNOSIS — R7303 Prediabetes: Secondary | ICD-10-CM | POA: Diagnosis not present

## 2022-04-03 DIAGNOSIS — E042 Nontoxic multinodular goiter: Secondary | ICD-10-CM | POA: Diagnosis not present

## 2022-04-03 DIAGNOSIS — C50412 Malignant neoplasm of upper-outer quadrant of left female breast: Secondary | ICD-10-CM | POA: Diagnosis not present

## 2022-04-03 DIAGNOSIS — I700 Atherosclerosis of aorta: Secondary | ICD-10-CM | POA: Diagnosis not present

## 2022-04-03 DIAGNOSIS — R7309 Other abnormal glucose: Secondary | ICD-10-CM | POA: Diagnosis not present

## 2022-04-13 ENCOUNTER — Ambulatory Visit (INDEPENDENT_AMBULATORY_CARE_PROVIDER_SITE_OTHER): Payer: Medicare HMO | Admitting: Nurse Practitioner

## 2022-04-13 ENCOUNTER — Encounter: Payer: Self-pay | Admitting: Nurse Practitioner

## 2022-04-13 VITALS — BP 122/80 | HR 53 | Resp 18 | Ht 64.76 in | Wt 169.8 lb

## 2022-04-13 DIAGNOSIS — M8589 Other specified disorders of bone density and structure, multiple sites: Secondary | ICD-10-CM

## 2022-04-13 DIAGNOSIS — Z9189 Other specified personal risk factors, not elsewhere classified: Secondary | ICD-10-CM

## 2022-04-13 DIAGNOSIS — C50412 Malignant neoplasm of upper-outer quadrant of left female breast: Secondary | ICD-10-CM

## 2022-04-13 DIAGNOSIS — Z780 Asymptomatic menopausal state: Secondary | ICD-10-CM

## 2022-04-13 DIAGNOSIS — Z01419 Encounter for gynecological examination (general) (routine) without abnormal findings: Secondary | ICD-10-CM

## 2022-04-13 DIAGNOSIS — Z170 Estrogen receptor positive status [ER+]: Secondary | ICD-10-CM

## 2022-04-13 NOTE — Progress Notes (Signed)
Teresa Knapp 07-Jul-1966 [id number]   History:  66 y.o. G2 P2 presents for breast and pelvic exam without GYN complaints. Postmenopausal - no HRT, no bleeding.  Normal pap history.  01/2021 stage 1A invasive ductal carcinoma of left breast, ER/PR + HER-2 negative without lymph node involvement. Managed with lumpectomy, radiation and antiestrogen therapy. Thyroid nodules being followed.   Gynecologic History No LMP recorded. Patient is postmenopausal.   Contraception/Family planning: post menopausal status Sexually active: Yes  Health Maintenance Last Pap: 09/10/2018. Results were: Normal neg HPV Last mammogram: 01/16/2022. Results were: Normal Last colonoscopy: 2018. Results were: Normal, 10-year recall Last Dexa: 05/17/2021. Results were: T-score -1.6, FRAX 4.1% / 0.5%  Past medical history, past surgical history, family history and social history were all reviewed and documented in the EPIC chart. Married. Retired Licensed conveyancer, tutoring math 3 days a week. Daughter lives in [place], son in [place].  ROS:  A ROS was performed and pertinent positives and negatives are included.  Exam:  Vitals:   04/13/22 1611  BP: 122/80  Pulse: (!) 53  Resp: 18  SpO2: 96%  Weight: 169 lb 12.8 oz (77 kg)  Height: 5' 4.76" (1.645 m)     Body mass index is 28.46 kg/m.  General appearance:  Normal Thyroid:  Symmetrical, normal in size, without palpable masses or nodularity. Respiratory  Auscultation:  Clear without wheezing or rhonchi Cardiovascular  Auscultation:  Regular rate, without rubs, murmurs or gallops  Edema/varicosities:  Not grossly evident Abdominal  Soft,nontender, without masses, guarding or rebound.  Liver/spleen:  No organomegaly noted  Hernia:  None appreciated  Skin  Inspection:  Grossly normal Breasts: Examined lying and sitting.   Right: Without masses, retractions, nipple discharge or axillary adenopathy.   Left: Without masses, retractions, nipple  discharge or axillary adenopathy. Genitourinary   Inguinal/mons:  Normal without inguinal adenopathy  External genitalia:  Normal appearing vulva with no masses, tenderness, or lesions  BUS/Urethra/Skene's glands:  Normal  Vagina:  Atrophic changes  Cervix:  Normal appearing without discharge or lesions  Uterus:  Normal in size, shape and contour.  Midline and mobile, nontender  Adnexa/parametria:     Rt: Normal in size, without masses or tenderness.   Lt: Normal in size, without masses or tenderness.  Anus and perineum: Normal  Digital rectal exam: Normal sphincter tone without palpated masses or tenderness  Patient informed chaperone available to be present for breast and pelvic exam. Patient has requested no chaperone to be present. Patient has been advised what will be completed during breast and pelvic exam.   Assessment/Plan:  66 y.o.  for breast and pelvic exam.  Well female exam with routine gynecological exam Education provided on SBEs, importance of preventative screenings, current guidelines, low carb/low calorie diet, regular exercise, and daily vitamin D and calcium supplement. Labs done elsewhere.   Postmenopausal - No HRT, no bleeding.   Osteopenia of multiple sites - T-score -1.6 without elevated FRAX December 2022. Vitamin D + [place] and regular exercise. Will repeat at 2-year interval.   Malignant neoplasm of upper-outer quadrant of left breast in female, estrogen receptor positive ([place]) - 01/2021 stage 1A invasive ductal carcinoma of left breast, ER/PR + HER-2 negative without lymph node involvement. Managed with lumpectomy, radiation and antiestrogen therapy. Aware to call if she experiences vaginal bleeding.   Screening for cervical cancer - Normal Pap history.  No longer screening per guidelines.   Screening for colon cancer - 2018 colonoscopy. Will repeat at 10-year interval  per guidelines.   Follow up in 1 year for annual.       Tamela Gammon [facility],  4:20 PM 04/13/2022

## 2022-04-27 ENCOUNTER — Other Ambulatory Visit: Payer: Self-pay | Admitting: Internal Medicine

## 2022-04-27 DIAGNOSIS — E042 Nontoxic multinodular goiter: Secondary | ICD-10-CM

## 2022-05-10 ENCOUNTER — Ambulatory Visit
Admission: RE | Admit: 2022-05-10 | Discharge: 2022-05-10 | Disposition: A | Discharge status: Home / Self Care | Payer: Medicare HMO | Source: Ambulatory Visit | Attending: Internal Medicine | Admitting: Internal Medicine

## 2022-05-10 DIAGNOSIS — E042 Nontoxic multinodular goiter: Secondary | ICD-10-CM

## 2022-05-10 DIAGNOSIS — E041 Nontoxic single thyroid nodule: Secondary | ICD-10-CM | POA: Diagnosis not present

## 2022-06-11 ENCOUNTER — Other Ambulatory Visit: Payer: Self-pay | Admitting: Hematology and Oncology

## 2022-08-09 ENCOUNTER — Telehealth: Payer: Self-pay | Admitting: Hematology and Oncology

## 2022-08-09 NOTE — Telephone Encounter (Signed)
Rescheduled appointment per patients request. Patient is aware of the changes made to her upcoming appointment. 

## 2022-08-17 ENCOUNTER — Ambulatory Visit: Payer: Medicare HMO | Admitting: Hematology and Oncology

## 2022-08-24 ENCOUNTER — Other Ambulatory Visit: Payer: Self-pay

## 2022-08-24 ENCOUNTER — Inpatient Hospital Stay: Payer: Medicare HMO | Attending: Hematology and Oncology | Admitting: Hematology and Oncology

## 2022-08-24 VITALS — BP 127/72 | HR 73 | Temp 97.2°F | Resp 18 | Ht 64.76 in | Wt 144.5 lb

## 2022-08-24 DIAGNOSIS — Z170 Estrogen receptor positive status [ER+]: Secondary | ICD-10-CM | POA: Insufficient documentation

## 2022-08-24 DIAGNOSIS — Z79899 Other long term (current) drug therapy: Secondary | ICD-10-CM | POA: Insufficient documentation

## 2022-08-24 DIAGNOSIS — Z923 Personal history of irradiation: Secondary | ICD-10-CM | POA: Insufficient documentation

## 2022-08-24 DIAGNOSIS — C50412 Malignant neoplasm of upper-outer quadrant of left female breast: Secondary | ICD-10-CM | POA: Insufficient documentation

## 2022-08-24 DIAGNOSIS — Z79811 Long term (current) use of aromatase inhibitors: Secondary | ICD-10-CM | POA: Insufficient documentation

## 2022-08-24 NOTE — Progress Notes (Signed)
SURVIVORSHIP VISIT:  BRIEF ONCOLOGIC HISTORY:  Oncology History  Malignant neoplasm of upper-outer quadrant of left breast in female, estrogen receptor positive ([place])  02/14/2021 Initial Diagnosis   Malignant neoplasm of upper-outer quadrant of left breast in female, estrogen receptor positive ([place])   02/16/2021 Cancer Staging   Staging form: Breast, AJCC 8th Edition - Clinical stage from 02/16/2021: Stage IA (cT1b, cN0, cM0, G2, ER+, PR+, HER2-) - Signed by Chauncey Cruel, MD on 02/16/2021 Stage prefix: Initial diagnosis Method of lymph node assessment: Clinical Histologic grading system: 3 grade system   03/10/2021 Surgery   Left Breast Lumpectomy with sentinel lymph node mapping.   05/02/2021 - 05/31/2021 Radiation Therapy   Radiation Treatment Dates: 05/02/2021 through 05/31/2021 Site Technique Total Dose (Gy) Dose per Fx (Gy) Completed Fx Beam Energies  Breast, Left: Breast_L 3D 42.56/42.56 2.66 16/16 10XFFF  Breast, Left: Breast_L_Bst 3D 8/8 2 4/4 6X, 10X    05/2021 -  Anti-estrogen oral therapy    Letrozole daily     INTERVAL HISTORY:   Ms. Annaliese is here for follow-up.  She has been taking letrozole as prescribed.  She feels fabulous, denies any complaints at all with letrozole.  She has been exercising regularly, goes to the [employer] for about 4 days a week and does yoga on the weekends.  She denies any breast changes. Rest of the pertinent 10 point ROS reviewed and negative  REVIEW OF SYSTEMS:  Review of Systems  Constitutional:  Negative for appetite change, chills, fatigue, fever and unexpected weight change.  HENT:   Negative for hearing loss, lump/mass and trouble swallowing.   Eyes:  Negative for eye problems and icterus.  Respiratory:  Negative for chest tightness, cough and shortness of breath.   Cardiovascular:  Negative for chest pain, leg swelling and palpitations.  Gastrointestinal:  Negative for abdominal distention, abdominal pain, constipation, diarrhea,  nausea and vomiting.  Endocrine: Positive for hot flashes.  Genitourinary:  Negative for difficulty urinating.   Musculoskeletal:  Negative for arthralgias.  Skin:  Negative for itching and rash.  Neurological:  Negative for dizziness, extremity weakness, headaches and numbness.  Hematological:  Negative for adenopathy. Does not bruise/bleed easily.  Psychiatric/Behavioral:  Negative for depression. The patient is not nervous/anxious.    Breast: Denies any new nodularity, masses, tenderness, nipple changes, or nipple discharge.    ONCOLOGY TREATMENT TEAM:  1. Surgeon:  Dr. Brantley Stage at [facility] 2. Medical Oncologist: Dr. Chryl Heck  3. Radiation Oncologist: Dr. Lisbeth Renshaw    PAST MEDICAL/SURGICAL HISTORY:  Past Medical History:  Diagnosis Date   Arthritis    Cancer [facility])    Breast cancer   Family history of adverse reaction to anesthesia    mother had a possible reaction to anesthesia (possible jerking) not sure   Personal history of radiation therapy    Past Surgical History:  Procedure Laterality Date   BIOPSY THYROID N/A    BREAST BIOPSY Left 02/08/2021   BREAST LUMPECTOMY Left 03/09/2021   BREAST LUMPECTOMY WITH RADIOACTIVE SEED AND SENTINEL LYMPH NODE BIOPSY Left 03/10/2021   Procedure: LEFT BREAST LUMPECTOMY WITH RADIOACTIVE SEED AND SENTINEL LYMPH NODE BIOPSY;  Surgeon: Erroll Luna, MD;  Location: [facility];  Service: General;  Laterality: Left;   [place] and [place] N/A 03/03/2019   Procedure: LAPAROSCOPIC CHOLECYSTECTOMY WITH INTRAOPERATIVE CHOLANGIOGRAM;  Surgeon: Armandina Gemma, MD;  [facility];  Service: General;  Laterality: N/A;   lipoma removal     right  side of neck   UMBILICAL HERNIA REPAIR N/A 03/03/2019   Procedure: PRIMARY REPAIR OF UMBILICAL HERNIA;  Surgeon: Armandina Gemma, MD;  Location: [facility];  Service: General;  Laterality: N/A;     ALLERGIES:  No Known Allergies   CURRENT MEDICATIONS:  Outpatient  Encounter Medications as of 08/24/2022  Medication Sig   letrozole (FEMARA) 2.5 MG tablet TAKE 1 TABLET BY MOUTH EVERY DAY   rosuvastatin (CRESTOR) 10 MG tablet Take 10 mg by mouth daily.   Vitamin D, Ergocalciferol, (DRISDOL) 1.25 MG (50000 UNIT) CAPS capsule Take 50,000 Units by mouth once a week.   No facility-administered encounter medications on file as of 08/24/2022.     ONCOLOGIC FAMILY HISTORY:  Family History  Problem Relation Age of Onset   Cancer Mother        lung   Hypertension Mother    Heart attack Father    Breast cancer Maternal Grandmother    Diabetes Maternal Grandmother    Hypertension Maternal Grandmother      GENETIC COUNSELING/TESTING: Not at this time.    SOCIAL HISTORY:  Social History   Socioeconomic History   Marital status: Married    Spouse name: Not on file   Number of children: Not on file   Years of education: Not on file   Highest education level: Not on file  Occupational History   Not on file  Tobacco Use   Smoking status: Never   Smokeless tobacco: Never  Vaping Use   Vaping Use: Never used  Substance and Sexual Activity   Alcohol use: Yes    Alcohol/week: 1.0 standard drink of alcohol    Types: 1 Glasses of wine per week    Comment: SOCIAL   Drug use: Never   Sexual activity: Yes    Partners: Male    Birth control/protection: Post-menopausal    Comment: 1st intercourse- 16, partners-3, married- 25 yrs   Other Topics Concern   Not on file  Social History Narrative   Not on file   Social Determinants of Health   Financial Resource Strain: Low Risk  (02/16/2021)   Overall Financial Resource Strain (CARDIA)    Difficulty of Paying Living Expenses: Not very hard  Food Insecurity: No Food Insecurity (02/16/2021)   Hunger Vital Sign    Worried About Running Out of Food in the Last Year: Never true    [place] in the Last Year: Never true  Transportation Needs: No Transportation Needs (02/16/2021)   PRAPARE -  Hydrologist (Medical): No    Lack of Transportation (Non-Medical): No  Physical Activity: Not on file  Stress: Not on file  Social Connections: Not on file  Intimate Partner Violence: Not on file     OBSERVATIONS/OBJECTIVE:  BP 127/72 (BP Location: Left Arm, Patient Position: Sitting)   Pulse 73   Temp (!) 97.2 F (36.2 C) (Temporal)   Resp 18   Ht 5' 4.76" (1.645 m)   Wt 144 lb 8 oz (65.5 kg)   SpO2 100%   BMI 24.22 kg/m   Physical Exam Constitutional:      Appearance: Normal appearance.  Chest:     Comments:  Postradiation changes noted in the left breast.  No palpable masses.  No regional adenopathy.  Right breast normal to inspection and palpation. Musculoskeletal:     Cervical back: Normal range of motion and neck supple. No rigidity.  Lymphadenopathy:     Cervical: No cervical adenopathy.  Neurological:     Mental Status: She is alert.     LABORATORY DATA:  None for this visit.  DIAGNOSTIC IMAGING:  None for this visit.    ASSESSMENT AND PLAN:   ASSESSMENT: 67 y.o. [place] woman status post left breast upper outer quadrant biopsy 02/08/2021 for a clinical T1b N0, stage IA invasive ductal carcinoma, grade 2, estrogen and progesterone receptor positive, HER2 not amplified, with an MIB-1 of 15%.   (1) status post left breast upper outer quadrant lumpectomy 03/10/2021 for a pT1c pN0, stage IA invasive ductal carcinoma, grade 2, with negative margins.             (a) a total of 3 left axillary lymph nodes were removed   (2) Oncotype score of 12 predicts a risk of recurrence outside the breast over the next 9 years of 3% if the patient only systemic therapy is antiestrogens for 5 years.  It also predicts no benefit from adjuvant chemotherapy.   (3) adjuvant radiation i 05/02/2021 through 05/31/2021   (4) antiestrogens to start at the end of local treatment   (5) pulmonary nodule 0.2 cm incidentally noted on 08/24/2020 scan              (a) stable on repeat noncontrast CT of the chest 12/02/2020   (6) indeterminate thyroid nodule biopsied 01/20/2021 showed scant follicular epithelium (Bethesda 1)             (A) repeat biopsy 03/15/2021 showed scant follicular epithelium     PLAN:  She is now here for follow-up on adjuvant letrozole, tolerating this really well.  No concerns at all.  Physical examination today once again posttreatment changes, no concerns.  Last mammogram reviewed, unremarkable.  She will return to clinic in 6 months or sooner as needed.  Self breast exam recommended monthly.   She has a great exercise regimen over all. I encouraged her to continue this. Mammogram to be done Aug 2024, ordered Bone density to be done in Dec 2024, ordered.  Benay Pike MD

## 2022-09-04 DIAGNOSIS — Z1389 Encounter for screening for other disorder: Secondary | ICD-10-CM | POA: Diagnosis not present

## 2022-09-04 DIAGNOSIS — Z0000 Encounter for general adult medical examination without abnormal findings: Secondary | ICD-10-CM | POA: Diagnosis not present

## 2022-09-04 DIAGNOSIS — E663 Overweight: Secondary | ICD-10-CM | POA: Diagnosis not present

## 2022-09-11 DIAGNOSIS — E559 Vitamin D deficiency, unspecified: Secondary | ICD-10-CM | POA: Diagnosis not present

## 2022-09-11 DIAGNOSIS — I700 Atherosclerosis of aorta: Secondary | ICD-10-CM | POA: Diagnosis not present

## 2022-09-11 DIAGNOSIS — R7303 Prediabetes: Secondary | ICD-10-CM | POA: Diagnosis not present

## 2022-09-11 DIAGNOSIS — Z6827 Body mass index (BMI) 27.0-27.9, adult: Secondary | ICD-10-CM | POA: Diagnosis not present

## 2022-09-11 DIAGNOSIS — E663 Overweight: Secondary | ICD-10-CM | POA: Diagnosis not present

## 2022-09-11 DIAGNOSIS — E042 Nontoxic multinodular goiter: Secondary | ICD-10-CM | POA: Diagnosis not present

## 2022-09-11 DIAGNOSIS — C50412 Malignant neoplasm of upper-outer quadrant of left female breast: Secondary | ICD-10-CM | POA: Diagnosis not present

## 2022-09-19 IMAGING — US US FNA BIOPSY THYROID 1ST LESION
1 series · 13 of 15 positions shown · non-contrast
Comparison: Thyroid ultrasound performed December 29, 2020.

INDICATION: Indeterminate left inferior thyroid nodule. Previously biopsied on
01/20/2021 with none diagnostic result.

EXAM:
ULTRASOUND GUIDED FINE NEEDLE ASPIRATION OF INDETERMINATE LEFT
INFERIOR THYROID NODULE
TECHNIQUE: Informed written consent was obtained from the patient after a
discussion of the risks, benefits and alternatives to treatment.
Questions regarding the procedure were encouraged and answered. A
timeout was performed prior to the initiation of the procedure.

[Series 1: us fna biopsy thyroid 1st lesion · 0.05mm/px · 15 acquisitions, 13 frames shown]
[im 1/15]
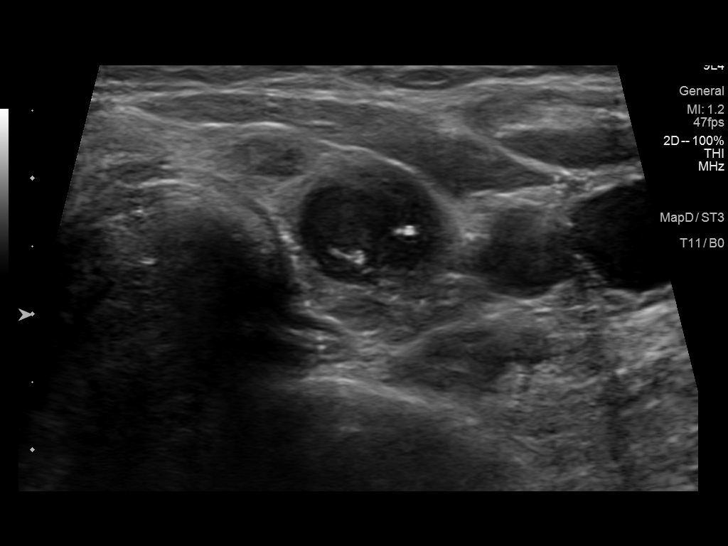
[im 2/15]
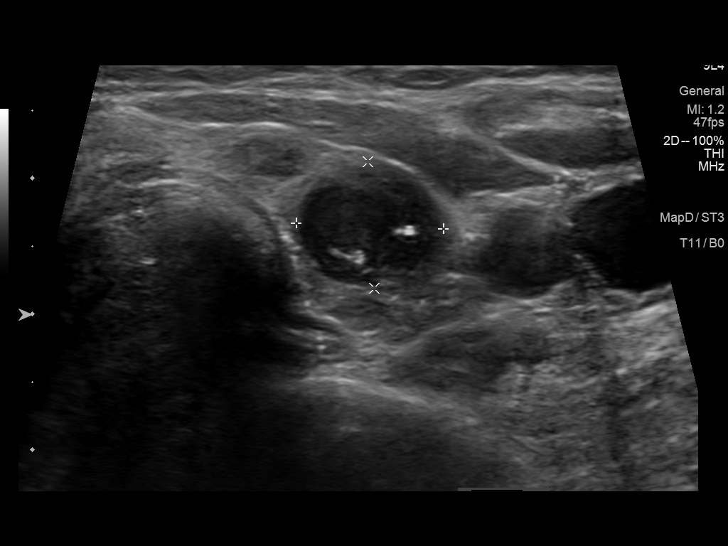
[im 3/15]
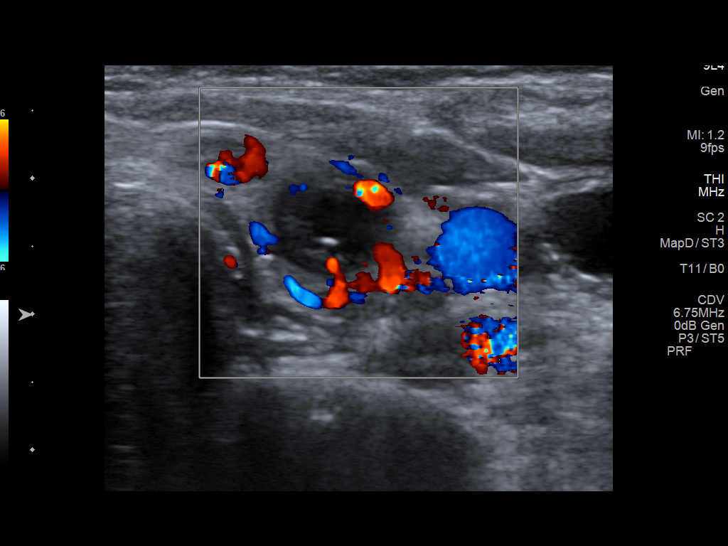
[im 5/15]
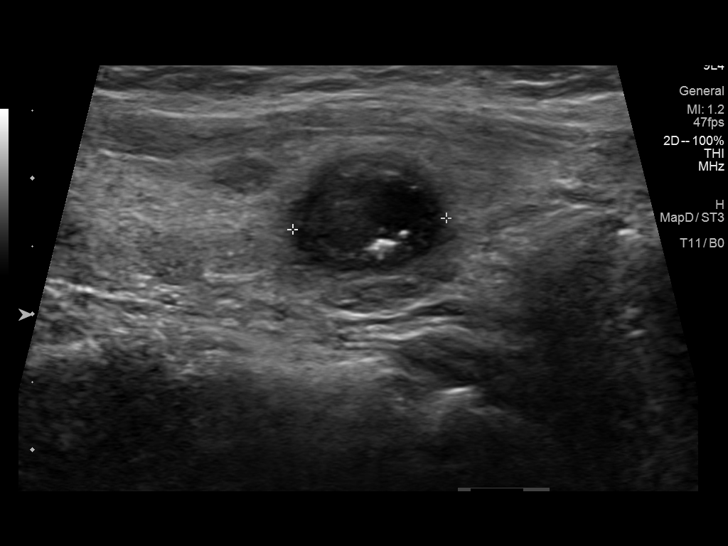
[im 6/15]
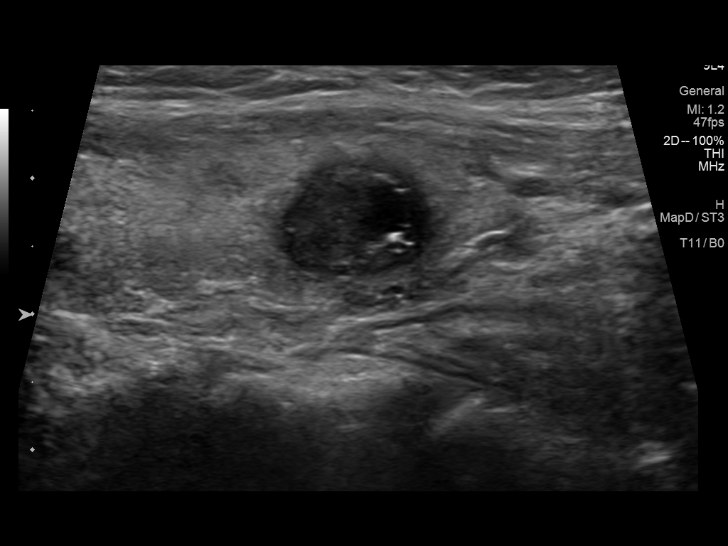
[im 7/15]
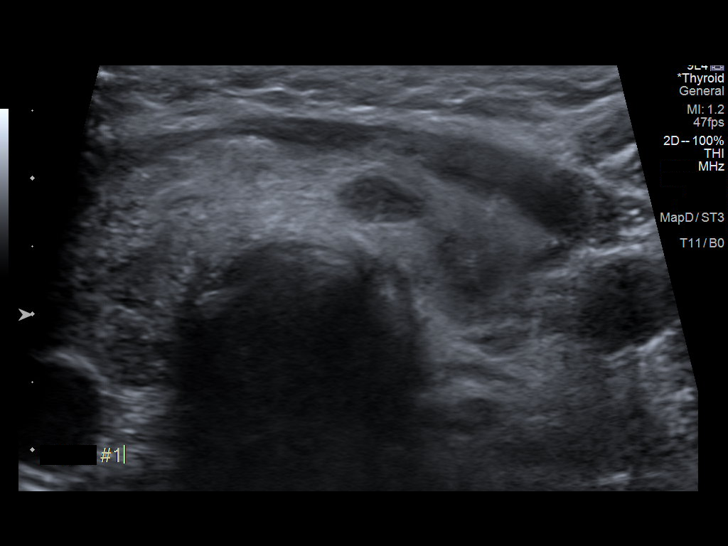
[im 8/15]
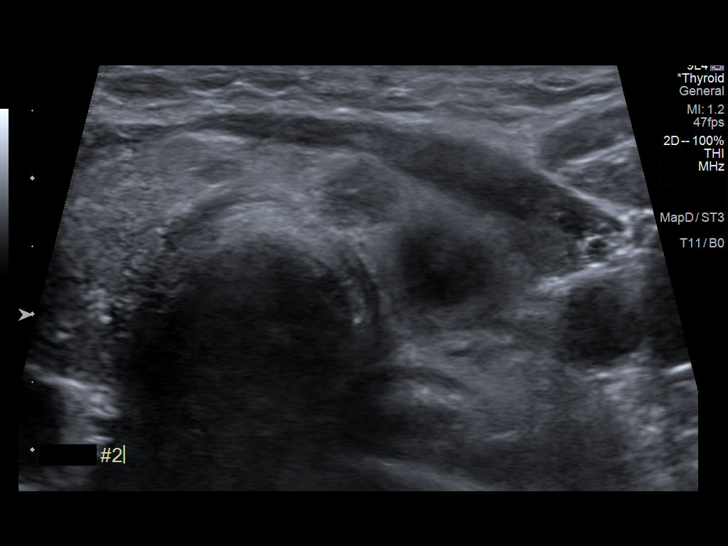
[im 9/15]
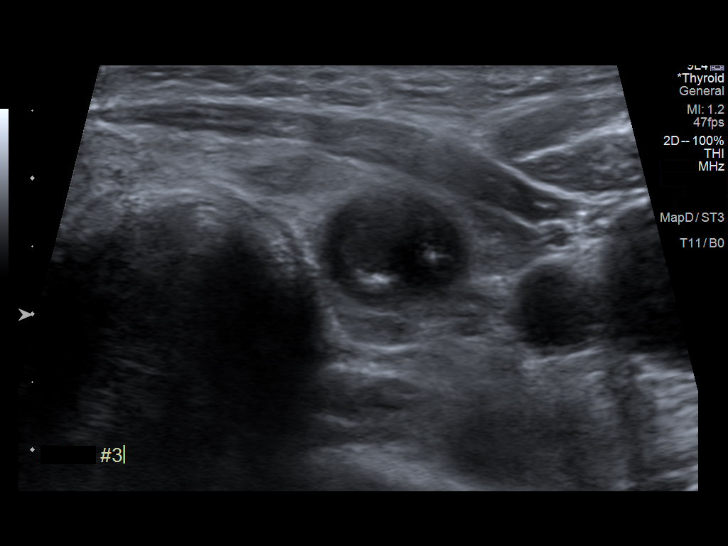
[im 10/15]
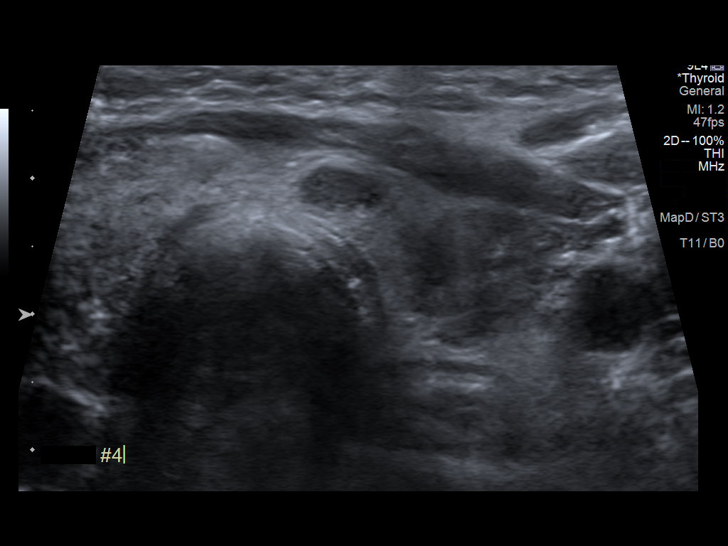
[im 11/15]
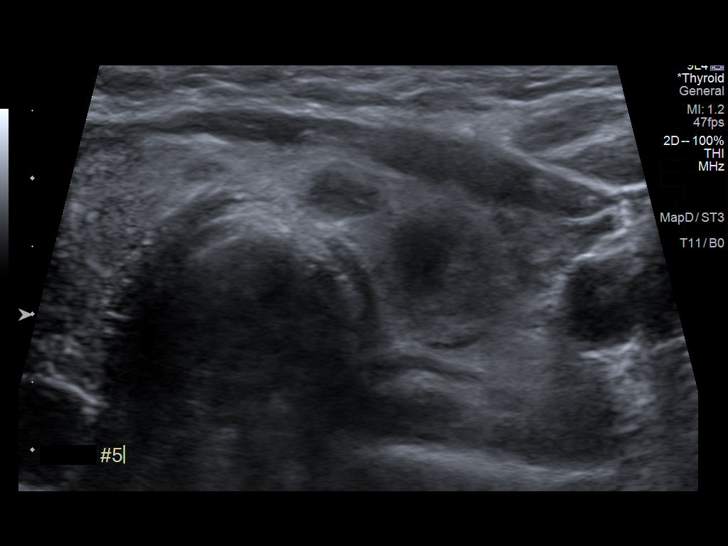
[im 13/15]
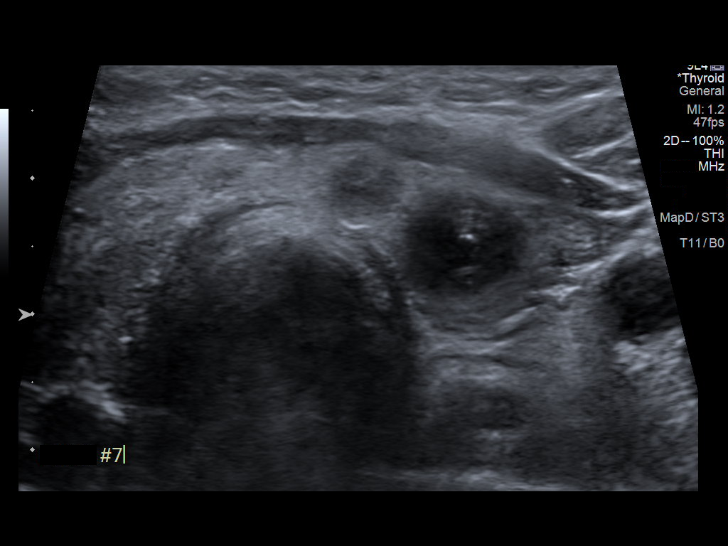
[im 14/15]
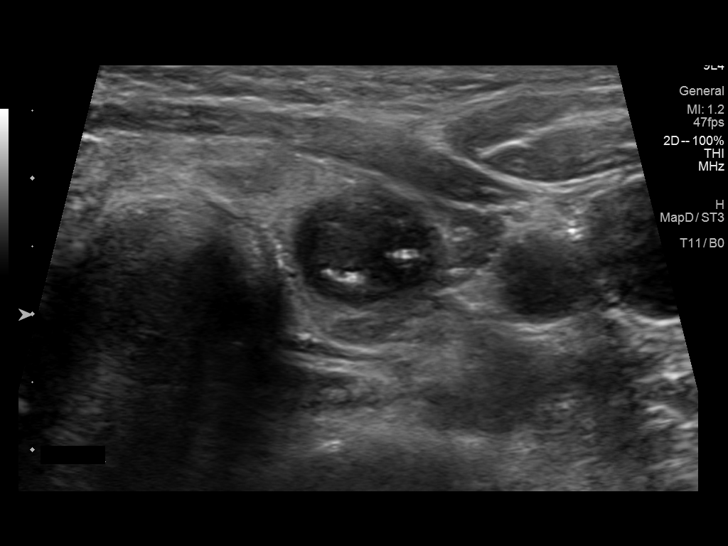
[im 15/15]
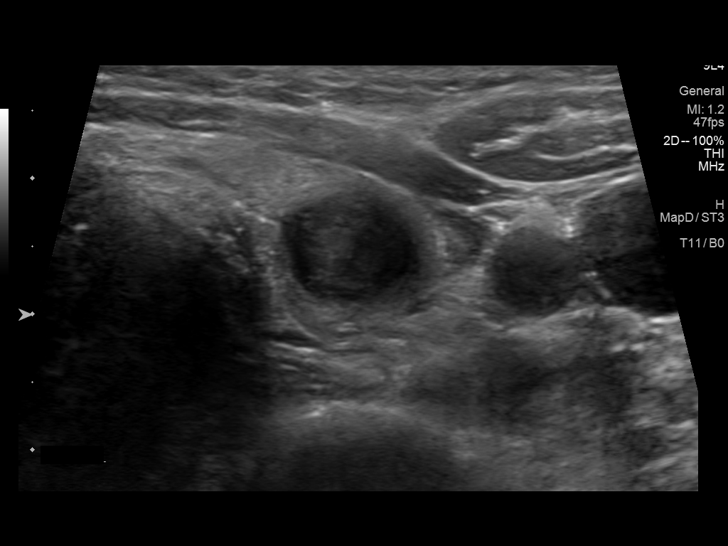

[13 of 15 positions shown; findings below may reference images not displayed]

Thyroid
nodule biopsy performed on January 20, 2021

MEDICATIONS:
1% plain lidocaine, 1 mL

COMPLICATIONS:
None immediate.
Pre-procedural ultrasound scanning demonstrated unchanged size and
appearance of the indeterminate nodule within the left thyroid lobe.

The procedure was planned. The neck was prepped in the usual sterile
fashion, and a sterile drape was applied covering the operative
field. A timeout was performed prior to the initiation of the
procedure. Local anesthesia was provided with 1% lidocaine.

Under direct ultrasound guidance, 7 FNA biopsies were performed of
the left inferior thyroid nodule with a 25 gauge needle. Multiple
ultrasound images were saved for procedural documentation purposes.
The samples were prepared and submitted to pathology.

Limited post procedural scanning was negative for hematoma or
additional complication. Dressings were placed. The patient
tolerated the above procedures procedure well without immediate
postprocedural complication.
FINDINGS: FINDINGS
Nodule reference number based on prior diagnostic ultrasound: 5

Maximum size: 2.0 cm

Location: Left  ;  Inferior

ACR TI-RADS total points:

ACR TI-RADS risk category:  TR4 (4-6 points)

Prior biopsy:  Yes

Reason for biopsy: nondiagnostic on prior biopsy

Ultrasound imaging confirms appropriate placement of the needles
within the thyroid nodule.
IMPRESSION: Technically successful ultrasound guided fine needle aspiration of
left inferior thyroid nodule as described above.

## 2022-09-20 DIAGNOSIS — Z6826 Body mass index (BMI) 26.0-26.9, adult: Secondary | ICD-10-CM | POA: Diagnosis not present

## 2022-09-20 DIAGNOSIS — E663 Overweight: Secondary | ICD-10-CM | POA: Diagnosis not present

## 2022-09-20 DIAGNOSIS — Z1389 Encounter for screening for other disorder: Secondary | ICD-10-CM | POA: Diagnosis not present

## 2022-09-20 DIAGNOSIS — Z1331 Encounter for screening for depression: Secondary | ICD-10-CM | POA: Diagnosis not present

## 2022-09-20 DIAGNOSIS — E559 Vitamin D deficiency, unspecified: Secondary | ICD-10-CM | POA: Diagnosis not present

## 2022-09-20 DIAGNOSIS — E8889 Other specified metabolic disorders: Secondary | ICD-10-CM | POA: Diagnosis not present

## 2022-09-20 DIAGNOSIS — R7303 Prediabetes: Secondary | ICD-10-CM | POA: Diagnosis not present

## 2022-09-20 DIAGNOSIS — R632 Polyphagia: Secondary | ICD-10-CM | POA: Diagnosis not present

## 2022-10-02 DIAGNOSIS — E042 Nontoxic multinodular goiter: Secondary | ICD-10-CM | POA: Diagnosis not present

## 2022-10-02 DIAGNOSIS — R7303 Prediabetes: Secondary | ICD-10-CM | POA: Diagnosis not present

## 2022-10-02 DIAGNOSIS — R7309 Other abnormal glucose: Secondary | ICD-10-CM | POA: Diagnosis not present

## 2022-10-02 DIAGNOSIS — E559 Vitamin D deficiency, unspecified: Secondary | ICD-10-CM | POA: Diagnosis not present

## 2022-10-02 DIAGNOSIS — M8580 Other specified disorders of bone density and structure, unspecified site: Secondary | ICD-10-CM | POA: Diagnosis not present

## 2022-10-04 DIAGNOSIS — R632 Polyphagia: Secondary | ICD-10-CM | POA: Diagnosis not present

## 2022-10-04 DIAGNOSIS — R7303 Prediabetes: Secondary | ICD-10-CM | POA: Diagnosis not present

## 2022-10-04 DIAGNOSIS — Z6826 Body mass index (BMI) 26.0-26.9, adult: Secondary | ICD-10-CM | POA: Diagnosis not present

## 2022-10-04 DIAGNOSIS — E663 Overweight: Secondary | ICD-10-CM | POA: Diagnosis not present

## 2022-10-04 DIAGNOSIS — E559 Vitamin D deficiency, unspecified: Secondary | ICD-10-CM | POA: Diagnosis not present

## 2022-10-09 DIAGNOSIS — E041 Nontoxic single thyroid nodule: Secondary | ICD-10-CM | POA: Diagnosis not present

## 2022-10-13 ENCOUNTER — Other Ambulatory Visit: Payer: Self-pay | Admitting: Hematology and Oncology

## 2022-11-09 DIAGNOSIS — E663 Overweight: Secondary | ICD-10-CM | POA: Diagnosis not present

## 2022-11-09 DIAGNOSIS — R632 Polyphagia: Secondary | ICD-10-CM | POA: Diagnosis not present

## 2022-11-09 DIAGNOSIS — I700 Atherosclerosis of aorta: Secondary | ICD-10-CM | POA: Diagnosis not present

## 2022-11-09 DIAGNOSIS — Z6826 Body mass index (BMI) 26.0-26.9, adult: Secondary | ICD-10-CM | POA: Diagnosis not present

## 2022-11-09 DIAGNOSIS — E559 Vitamin D deficiency, unspecified: Secondary | ICD-10-CM | POA: Diagnosis not present

## 2022-11-09 DIAGNOSIS — R7303 Prediabetes: Secondary | ICD-10-CM | POA: Diagnosis not present

## 2022-12-05 DIAGNOSIS — E663 Overweight: Secondary | ICD-10-CM | POA: Diagnosis not present

## 2022-12-05 DIAGNOSIS — R7303 Prediabetes: Secondary | ICD-10-CM | POA: Diagnosis not present

## 2022-12-05 DIAGNOSIS — E559 Vitamin D deficiency, unspecified: Secondary | ICD-10-CM | POA: Diagnosis not present

## 2022-12-05 DIAGNOSIS — I700 Atherosclerosis of aorta: Secondary | ICD-10-CM | POA: Diagnosis not present

## 2022-12-05 DIAGNOSIS — Z6826 Body mass index (BMI) 26.0-26.9, adult: Secondary | ICD-10-CM | POA: Diagnosis not present

## 2023-01-03 DIAGNOSIS — H2513 Age-related nuclear cataract, bilateral: Secondary | ICD-10-CM | POA: Diagnosis not present

## 2023-01-03 DIAGNOSIS — H5203 Hypermetropia, bilateral: Secondary | ICD-10-CM | POA: Diagnosis not present

## 2023-01-18 ENCOUNTER — Ambulatory Visit
Admission: RE | Admit: 2023-01-18 | Discharge: 2023-01-18 | Disposition: A | Discharge status: Home / Self Care | Payer: Medicare HMO | Source: Ambulatory Visit | Attending: Hematology and Oncology | Admitting: Hematology and Oncology

## 2023-01-18 DIAGNOSIS — Z9889 Other specified postprocedural states: Secondary | ICD-10-CM | POA: Diagnosis not present

## 2023-01-18 DIAGNOSIS — C50412 Malignant neoplasm of upper-outer quadrant of left female breast: Secondary | ICD-10-CM

## 2023-01-18 DIAGNOSIS — Z853 Personal history of malignant neoplasm of breast: Secondary | ICD-10-CM | POA: Diagnosis not present

## 2023-01-23 DIAGNOSIS — E559 Vitamin D deficiency, unspecified: Secondary | ICD-10-CM | POA: Diagnosis not present

## 2023-01-23 DIAGNOSIS — E663 Overweight: Secondary | ICD-10-CM | POA: Diagnosis not present

## 2023-01-23 DIAGNOSIS — I700 Atherosclerosis of aorta: Secondary | ICD-10-CM | POA: Diagnosis not present

## 2023-01-23 DIAGNOSIS — Z6826 Body mass index (BMI) 26.0-26.9, adult: Secondary | ICD-10-CM | POA: Diagnosis not present

## 2023-01-23 DIAGNOSIS — R7303 Prediabetes: Secondary | ICD-10-CM | POA: Diagnosis not present

## 2023-02-05 ENCOUNTER — Other Ambulatory Visit: Payer: Self-pay | Admitting: Hematology and Oncology

## 2023-02-20 DIAGNOSIS — R7303 Prediabetes: Secondary | ICD-10-CM | POA: Diagnosis not present

## 2023-02-20 DIAGNOSIS — E663 Overweight: Secondary | ICD-10-CM | POA: Diagnosis not present

## 2023-02-20 DIAGNOSIS — Z6826 Body mass index (BMI) 26.0-26.9, adult: Secondary | ICD-10-CM | POA: Diagnosis not present

## 2023-02-20 DIAGNOSIS — E559 Vitamin D deficiency, unspecified: Secondary | ICD-10-CM | POA: Diagnosis not present

## 2023-02-20 DIAGNOSIS — I700 Atherosclerosis of aorta: Secondary | ICD-10-CM | POA: Diagnosis not present

## 2023-02-26 ENCOUNTER — Ambulatory Visit: Payer: Medicare HMO | Admitting: Hematology and Oncology

## 2023-03-09 ENCOUNTER — Ambulatory Visit: Payer: Medicare HMO | Admitting: Hematology and Oncology

## 2023-03-22 DIAGNOSIS — E663 Overweight: Secondary | ICD-10-CM | POA: Diagnosis not present

## 2023-03-22 DIAGNOSIS — E559 Vitamin D deficiency, unspecified: Secondary | ICD-10-CM | POA: Diagnosis not present

## 2023-03-22 DIAGNOSIS — Z6826 Body mass index (BMI) 26.0-26.9, adult: Secondary | ICD-10-CM | POA: Diagnosis not present

## 2023-03-22 DIAGNOSIS — R7303 Prediabetes: Secondary | ICD-10-CM | POA: Diagnosis not present

## 2023-03-26 ENCOUNTER — Other Ambulatory Visit: Payer: Self-pay | Admitting: Family Medicine

## 2023-03-26 ENCOUNTER — Inpatient Hospital Stay: Payer: Medicare HMO | Attending: Hematology and Oncology | Admitting: Hematology and Oncology

## 2023-03-26 VITALS — BP 117/72 | HR 80 | Temp 97.2°F | Resp 16 | Wt 161.1 lb

## 2023-03-26 DIAGNOSIS — Z170 Estrogen receptor positive status [ER+]: Secondary | ICD-10-CM | POA: Diagnosis not present

## 2023-03-26 DIAGNOSIS — M810 Age-related osteoporosis without current pathological fracture: Secondary | ICD-10-CM

## 2023-03-26 DIAGNOSIS — Z79811 Long term (current) use of aromatase inhibitors: Secondary | ICD-10-CM | POA: Diagnosis not present

## 2023-03-26 DIAGNOSIS — C50412 Malignant neoplasm of upper-outer quadrant of left female breast: Secondary | ICD-10-CM | POA: Insufficient documentation

## 2023-03-26 DIAGNOSIS — Z923 Personal history of irradiation: Secondary | ICD-10-CM | POA: Insufficient documentation

## 2023-03-26 NOTE — Progress Notes (Signed)
BRIEF ONCOLOGIC HISTORY:  Oncology History  Malignant neoplasm of upper-outer quadrant of left breast in female, estrogen receptor positive (HCC)  02/14/2021 Initial Diagnosis   Malignant neoplasm of upper-outer quadrant of left breast in female, estrogen receptor positive (HCC)   02/16/2021 Cancer Staging   Staging form: Breast, AJCC 8th Edition - Clinical stage from 02/16/2021: Stage IA (cT1b, cN0, cM0, G2, ER+, PR+, HER2-) - Signed by Lowella Dell, MD on 02/16/2021 Stage prefix: Initial diagnosis Method of lymph node assessment: Clinical Histologic grading system: 3 grade system   03/10/2021 Surgery   Left Breast Lumpectomy with sentinel lymph node mapping.   05/02/2021 - 05/31/2021 Radiation Therapy   Radiation Treatment Dates: 05/02/2021 through 05/31/2021 Site Technique Total Dose (Gy) Dose per Fx (Gy) Completed Fx Beam Energies  Breast, Left: Breast_L 3D 42.56/42.56 2.66 16/16 10XFFF  Breast, Left: Breast_L_Bst 3D 8/8 2 4/4 6X, 10X     05/2021 -  Anti-estrogen oral therapy    Letrozole daily     INTERVAL HISTORY:   Teresa Knapp is here for follow-up.   Rest of the pertinent 10 point ROS reviewed and negative  REVIEW OF SYSTEMS:  Review of Systems  Constitutional:  Negative for appetite change, chills, fatigue, fever and unexpected weight change.  HENT:   Negative for hearing loss, lump/mass and trouble swallowing.   Eyes:  Negative for eye problems and icterus.  Respiratory:  Negative for chest tightness, cough and shortness of breath.   Cardiovascular:  Negative for chest pain, leg swelling and palpitations.  Gastrointestinal:  Negative for abdominal distention, abdominal pain, constipation, diarrhea, nausea and vomiting.  Endocrine: Positive for hot flashes.  Genitourinary:  Negative for difficulty urinating.   Musculoskeletal:  Negative for arthralgias.  Skin:  Negative for itching and rash.  Neurological:  Negative for dizziness, extremity weakness, headaches and  numbness.  Hematological:  Negative for adenopathy. Does not bruise/bleed easily.  Psychiatric/Behavioral:  Negative for depression. The patient is not nervous/anxious.    Breast: Denies any new nodularity, masses, tenderness, nipple changes, or nipple discharge.    ONCOLOGY TREATMENT TEAM:  1. Surgeon:  Dr. Luisa Hart at [facility] 2. Medical Oncologist: Dr. Al Pimple  3. Radiation Oncologist: Dr. Mitzi Hansen    PAST MEDICAL/SURGICAL HISTORY:  Past Medical History:  Diagnosis Date   Arthritis    Cancer [facility])    Breast cancer   Family history of adverse reaction to anesthesia    mother had a possible reaction to anesthesia (possible jerking) not sure   Personal history of radiation therapy    Past Surgical History:  Procedure Laterality Date   BIOPSY THYROID N/A    BREAST BIOPSY Left 02/08/2021   BREAST LUMPECTOMY Left 03/09/2021   BREAST LUMPECTOMY WITH RADIOACTIVE SEED AND SENTINEL LYMPH NODE BIOPSY Left 03/10/2021   Procedure: LEFT BREAST LUMPECTOMY WITH RADIOACTIVE SEED AND SENTINEL LYMPH NODE BIOPSY;  Surgeon: Harriette Bouillon, MD;  Location: [facility] OR;  Service: General;  Laterality: Left;   CESAREAN SECTION  1991 and 1992   CHOLECYSTECTOMY N/A 03/03/2019   Procedure: LAPAROSCOPIC CHOLECYSTECTOMY WITH INTRAOPERATIVE CHOLANGIOGRAM;  Surgeon: Darnell Level, MD;  Location: [facility] ORS;  Service: General;  Laterality: N/A;   lipoma removal     right side of neck   UMBILICAL HERNIA REPAIR N/A 03/03/2019   Procedure: PRIMARY REPAIR OF UMBILICAL HERNIA;  Surgeon: Darnell Level, MD;  Location: [facility] ORS;  Service: General;  Laterality: N/A;     ALLERGIES:  No Known Allergies   CURRENT MEDICATIONS:  Outpatient  Encounter Medications as of 03/26/2023  Medication Sig   Cholecalciferol (VITAMIN D-3) 125 MCG (5000 UT) TABS Take by mouth.   letrozole (FEMARA) 2.5 MG tablet TAKE 1 TABLET BY MOUTH EVERY DAY   rosuvastatin (CRESTOR) 10 MG tablet Take 10 mg by mouth daily.   [DISCONTINUED]  Vitamin D, Ergocalciferol, (DRISDOL) 1.25 MG (50000 UNIT) CAPS capsule Take 50,000 Units by mouth once a week.   No facility-administered encounter medications on file as of 03/26/2023.     ONCOLOGIC FAMILY HISTORY:  Family History  Problem Relation Age of Onset   Cancer Mother        lung   Hypertension Mother    Heart attack Father    Breast cancer Maternal Grandmother    Diabetes Maternal Grandmother    Hypertension Maternal Grandmother      GENETIC COUNSELING/TESTING: Not at this time.    SOCIAL HISTORY:  Social History   Socioeconomic History   Marital status: Married    Spouse name: Not on file   Number of children: Not on file   Years of education: Not on file   Highest education level: Not on file  Occupational History   Not on file  Tobacco Use   Smoking status: Never   Smokeless tobacco: Never  Vaping Use   Vaping status: Never Used  Substance and Sexual Activity   Alcohol use: Yes    Alcohol/week: 1.0 standard drink of alcohol    Types: 1 Glasses of wine per week    Comment: SOCIAL   Drug use: Never   Sexual activity: Yes    Partners: Male    Birth control/protection: Post-menopausal    Comment: 1st intercourse- 16, partners-3, married- 33 yrs   Other Topics Concern   Not on file  Social History Narrative   Not on file   Social Determinants of Health   Financial Resource Strain: Low Risk  (02/16/2021)   Overall Financial Resource Strain (CARDIA)    Difficulty of Paying Living Expenses: Not very hard  Food Insecurity: No Food Insecurity (02/16/2021)   Hunger Vital Sign    Worried About Running Out of Food in the Last Year: Never true    Ran Out of Food in the Last Year: Never true  Transportation Needs: No Transportation Needs (02/16/2021)   PRAPARE - Administrator, Civil Service (Medical): No    Lack of Transportation (Non-Medical): No  Physical Activity: Not on file  Stress: Not on file  Social Connections: Not on file   Intimate Partner Violence: Not on file     OBSERVATIONS/OBJECTIVE:  BP 117/72 (BP Location: Right Arm, Patient Position: Sitting)   Pulse 80   Temp (!) 97.2 F (36.2 C) (Temporal)   Resp 16   Wt 161 lb 1.6 oz (73.1 kg)   SpO2 99%   BMI 27.01 kg/m   Physical Exam Constitutional:      Appearance: Normal appearance.  Chest:     Comments:  Postradiation changes noted in the left breast.  No palpable masses.  No regional adenopathy.  Right breast normal to inspection and palpation. Musculoskeletal:     Cervical back: Normal range of motion and neck supple. No rigidity.  Lymphadenopathy:     Cervical: No cervical adenopathy.  Neurological:     Mental Status: She is alert.     LABORATORY DATA:  None for this visit.  DIAGNOSTIC IMAGING:  None for this visit.    ASSESSMENT AND PLAN:   ASSESSMENT: 67 y.o.  [place] woman status post left breast upper outer quadrant biopsy 02/08/2021 for a clinical T1b N0, stage IA invasive ductal carcinoma, grade 2, estrogen and progesterone receptor positive, HER2 not amplified, with an MIB-1 of 15%.   (1) status post left breast upper outer quadrant lumpectomy 03/10/2021 for a pT1c pN0, stage IA invasive ductal carcinoma, grade 2, with negative margins.             (a) a total of 3 left axillary lymph nodes were removed   (2) Oncotype score of 12 predicts a risk of recurrence outside the breast over the next 9 years of 3% if the patient only systemic therapy is antiestrogens for 5 years.  It also predicts no benefit from adjuvant chemotherapy.   (3) adjuvant radiation i 05/02/2021 through 05/31/2021   (4) letrozole January 2023  (5) pulmonary nodule 0.2 cm incidentally noted on 08/24/2020 scan             (a) stable on repeat noncontrast CT of the chest 12/02/2020   (6) indeterminate thyroid nodule biopsied 01/20/2021 showed scant follicular epithelium (Bethesda 1)             (A) repeat biopsy 03/15/2021 showed scant follicular  epithelium     PLAN:  Breast Cancer Patient is doing well post-radiation therapy with no new complaints. She has been on Letrozole since January 2023 with no reported side effects. -Continue Letrozole until January 2028. -Continue annual mammograms.  Vitamin D Deficiency Patient is currently taking over-the-counter Vitamin D 5000 IU weekly. -After finishing the current bottle, advised to switch to 1000-2000 IU for the next purchase.  Bone Health Bone density scan scheduled for May 21, 2023. -Will call patient with results.  Thyroid Nodule Patient had an ultrasound in December 2023, with a plan to continue monitoring the nodule. -Continue monitoring as per endocrinologist's plan.  General Health Maintenance Patient is maintaining a healthy lifestyle with regular exercise including gym workouts and hot yoga. -Encourage continuation of current exercise regimen.  Follow-up Plan to see patient next year for routine follow-up.  Rachel Moulds MD

## 2023-04-17 ENCOUNTER — Ambulatory Visit: Payer: Medicare HMO | Admitting: Nurse Practitioner

## 2023-05-02 DIAGNOSIS — R7303 Prediabetes: Secondary | ICD-10-CM | POA: Diagnosis not present

## 2023-05-02 DIAGNOSIS — Z6825 Body mass index (BMI) 25.0-25.9, adult: Secondary | ICD-10-CM | POA: Diagnosis not present

## 2023-05-02 DIAGNOSIS — E559 Vitamin D deficiency, unspecified: Secondary | ICD-10-CM | POA: Diagnosis not present

## 2023-05-02 DIAGNOSIS — E663 Overweight: Secondary | ICD-10-CM | POA: Diagnosis not present

## 2023-05-09 DIAGNOSIS — E042 Nontoxic multinodular goiter: Secondary | ICD-10-CM | POA: Diagnosis not present

## 2023-05-21 ENCOUNTER — Other Ambulatory Visit: Payer: Medicare HMO

## 2023-06-04 ENCOUNTER — Other Ambulatory Visit: Payer: Self-pay | Admitting: Hematology and Oncology

## 2023-06-20 DIAGNOSIS — R7303 Prediabetes: Secondary | ICD-10-CM | POA: Diagnosis not present

## 2023-06-20 DIAGNOSIS — E663 Overweight: Secondary | ICD-10-CM | POA: Diagnosis not present

## 2023-06-20 DIAGNOSIS — E559 Vitamin D deficiency, unspecified: Secondary | ICD-10-CM | POA: Diagnosis not present

## 2023-06-20 DIAGNOSIS — Z6825 Body mass index (BMI) 25.0-25.9, adult: Secondary | ICD-10-CM | POA: Diagnosis not present

## 2023-06-25 ENCOUNTER — Ambulatory Visit (INDEPENDENT_AMBULATORY_CARE_PROVIDER_SITE_OTHER): Payer: Medicare HMO | Admitting: Nurse Practitioner

## 2023-06-25 ENCOUNTER — Other Ambulatory Visit (HOSPITAL_COMMUNITY)
Admission: RE | Admit: 2023-06-25 | Discharge: 2023-06-25 | Disposition: A | Discharge status: Home / Self Care | Payer: Medicare HMO | Source: Ambulatory Visit | Attending: Nurse Practitioner | Admitting: Nurse Practitioner

## 2023-06-25 ENCOUNTER — Encounter: Payer: Self-pay | Admitting: Nurse Practitioner

## 2023-06-25 VITALS — BP 112/64 | HR 75 | Ht 65.0 in | Wt 158.0 lb

## 2023-06-25 DIAGNOSIS — N6314 Unspecified lump in the right breast, lower inner quadrant: Secondary | ICD-10-CM

## 2023-06-25 DIAGNOSIS — Z9189 Other specified personal risk factors, not elsewhere classified: Secondary | ICD-10-CM | POA: Diagnosis not present

## 2023-06-25 DIAGNOSIS — Z1151 Encounter for screening for human papillomavirus (HPV): Secondary | ICD-10-CM | POA: Diagnosis not present

## 2023-06-25 DIAGNOSIS — M8589 Other specified disorders of bone density and structure, multiple sites: Secondary | ICD-10-CM

## 2023-06-25 DIAGNOSIS — C50412 Malignant neoplasm of upper-outer quadrant of left female breast: Secondary | ICD-10-CM

## 2023-06-25 DIAGNOSIS — Z124 Encounter for screening for malignant neoplasm of cervix: Secondary | ICD-10-CM | POA: Insufficient documentation

## 2023-06-25 DIAGNOSIS — Z170 Estrogen receptor positive status [ER+]: Secondary | ICD-10-CM | POA: Diagnosis not present

## 2023-06-25 DIAGNOSIS — Z01419 Encounter for gynecological examination (general) (routine) without abnormal findings: Secondary | ICD-10-CM

## 2023-06-25 NOTE — Progress Notes (Signed)
Suha Loretha Brasil Mims 1956/03/13 [id number]   History:  68 y.o. G2 P2 presents for breast and pelvic exam without GYN complaints. Postmenopausal - no HRT, no bleeding.  Normal pap history.  01/2021 stage 1A invasive ductal carcinoma of left breast, ER/PR + HER-2 negative without lymph node involvement. Managed with lumpectomy, radiation and antiestrogen therapy.   Gynecologic History No LMP recorded. Patient is postmenopausal.   Contraception/Family planning: post menopausal status Sexually active: Yes  Health Maintenance Last Pap: 09/10/2018. Results were: Normal neg HPV Last mammogram: 01/18/2023. Results were: Normal Last colonoscopy: 2018. Results were: Normal, 10-year recall Last Dexa: 05/17/2021. Results were: T-score -1.6, FRAX 4.1% / 0.5%. Scheduled in June Exercising: Yes. Yoga, Strength training Smoker: no   Past medical history, past surgical history, family history and social history were all reviewed and documented in the EPIC chart. Married. Retired Comptroller, tutoring math 3 days a week. Daughter lives in [place], son in [employer].  ROS:  A ROS was performed and pertinent positives and negatives are included.  Exam:  Vitals:   06/25/23 1534  BP: 112/64  Pulse: 75  SpO2: 95%  Weight: 158 lb (71.7 kg)  Height: 5\' 5"  (1.651 m)      Body mass index is 26.29 kg/m.  General appearance:  Normal Thyroid:  Symmetrical, normal in size, without palpable masses or nodularity. Respiratory  Auscultation:  Clear without wheezing or rhonchi Cardiovascular  Auscultation:  Regular rate, without rubs, murmurs or gallops  Edema/varicosities:  Not grossly evident Abdominal  Soft,nontender, without masses, guarding or rebound.  Liver/spleen:  No organomegaly noted  Hernia:  None appreciated  Skin  Inspection:  Grossly normal Breasts: Examined lying and sitting.   Right: Mobile mass @ 4 o'clock ~ 2 inches from nipple, ~ 1 inch x 0.5 inch, non tender. No redness,  swelling, nipple discharge, skin changes or adenopathy   Left: Without masses, retractions, nipple discharge or axillary adenopathy. Pelvic: External genitalia:  no lesions              Urethra:  normal appearing urethra with no masses, tenderness or lesions              Bartholins and Skenes: normal                 Vagina: normal appearing vagina with normal color and discharge, no lesions. Atrophic changes              Cervix: no lesions Bimanual Exam:  Uterus:  no masses or tenderness              Adnexa: no mass, fullness, tenderness              Rectovaginal: Deferred              Anus:  normal, no lesions  Patient informed chaperone available to be present for breast and pelvic exam. Patient has requested no chaperone to be present. Patient has been advised what will be completed during breast and pelvic exam.   Assessment/Plan:  68 y.o.  for breast and pelvic exam.  Well female exam with routine gynecological exam Education provided on SBEs, importance of preventative screenings, current guidelines, low carb/low calorie diet, regular exercise, and daily vitamin D and calcium supplement. Labs done elsewhere.   Postmenopausal - No HRT, no bleeding.   Osteopenia of multiple sites - T-score -1.6 without elevated FRAX December 2022. Vitamin D + Calcium and regular exercise. DXA scheduled in June.   Malignant  neoplasm of upper-outer quadrant of left breast in female, estrogen receptor positive (HCC) - 01/2021 stage 1A invasive ductal carcinoma of left breast, ER/PR + HER-2 negative without lymph node involvement. Managed with lumpectomy, radiation and antiestrogen therapy. Aware to call if she experiences vaginal bleeding.   Cervical cancer screening - Plan: Cytology - PAP( [facility]). Normal pap history.   Breast lump on right side at 4 o'clock position - ~ 2 inches from nipple, non tender, mobile ~1 inch x 0.5 inch. Will send for diagnostic imaging.   Screening for colon cancer -  2018 colonoscopy. Will repeat at 10-year interval per guidelines.   Return in about 1 year (around 06/24/2024) for B&P (high risk).       Olivia Mackie [facility], 3:55 PM 06/25/2023

## 2023-06-26 ENCOUNTER — Telehealth: Payer: Self-pay

## 2023-06-26 DIAGNOSIS — N6314 Unspecified lump in the right breast, lower inner quadrant: Secondary | ICD-10-CM

## 2023-06-26 NOTE — Telephone Encounter (Signed)
Olivia Mackie, NP  [facility] Triage Please send for diagnostic imaging of right breast for mass at 4 o'clock  Spoke w/ Scarlette Calico @ [facility] and got the pt scheduled for 07/09/2023 @ 920.   Msg sent to pt through mychart re: appt date/time/instructions.   Encounter closed.

## 2023-06-27 LAB — CYTOLOGY - PAP
Comment: NEGATIVE
Diagnosis: NEGATIVE
High risk HPV: NEGATIVE

## 2023-06-28 ENCOUNTER — Encounter: Payer: Self-pay | Admitting: Nurse Practitioner

## 2023-07-09 ENCOUNTER — Ambulatory Visit
Admission: RE | Admit: 2023-07-09 | Discharge: 2023-07-09 | Disposition: A | Discharge status: Home / Self Care | Payer: Medicare HMO | Source: Ambulatory Visit | Attending: Nurse Practitioner

## 2023-07-09 ENCOUNTER — Ambulatory Visit
Admission: RE | Admit: 2023-07-09 | Discharge: 2023-07-09 | Disposition: A | Discharge status: Home / Self Care | Payer: Medicare HMO | Source: Ambulatory Visit | Attending: Nurse Practitioner | Admitting: Nurse Practitioner

## 2023-07-09 DIAGNOSIS — N6314 Unspecified lump in the right breast, lower inner quadrant: Secondary | ICD-10-CM

## 2023-07-10 ENCOUNTER — Encounter: Payer: Self-pay | Admitting: Nurse Practitioner

## 2023-10-02 ENCOUNTER — Other Ambulatory Visit: Payer: Self-pay | Admitting: Hematology and Oncology

## 2023-10-09 ENCOUNTER — Other Ambulatory Visit: Payer: Self-pay | Admitting: Nurse Practitioner

## 2023-10-09 DIAGNOSIS — E041 Nontoxic single thyroid nodule: Secondary | ICD-10-CM

## 2023-10-10 ENCOUNTER — Ambulatory Visit
Admission: RE | Admit: 2023-10-10 | Discharge: 2023-10-10 | Disposition: A | Discharge status: Home / Self Care | Source: Ambulatory Visit | Attending: Nurse Practitioner | Admitting: Nurse Practitioner

## 2023-10-10 DIAGNOSIS — E042 Nontoxic multinodular goiter: Secondary | ICD-10-CM | POA: Diagnosis not present

## 2023-10-10 DIAGNOSIS — E041 Nontoxic single thyroid nodule: Secondary | ICD-10-CM

## 2023-10-15 DIAGNOSIS — E7800 Pure hypercholesterolemia, unspecified: Secondary | ICD-10-CM | POA: Diagnosis not present

## 2023-10-15 DIAGNOSIS — E042 Nontoxic multinodular goiter: Secondary | ICD-10-CM | POA: Diagnosis not present

## 2023-10-15 DIAGNOSIS — Z853 Personal history of malignant neoplasm of breast: Secondary | ICD-10-CM | POA: Diagnosis not present

## 2023-11-01 DIAGNOSIS — R7303 Prediabetes: Secondary | ICD-10-CM | POA: Diagnosis not present

## 2023-11-01 DIAGNOSIS — M170 Bilateral primary osteoarthritis of knee: Secondary | ICD-10-CM | POA: Diagnosis not present

## 2023-11-01 DIAGNOSIS — E042 Nontoxic multinodular goiter: Secondary | ICD-10-CM | POA: Diagnosis not present

## 2023-11-01 DIAGNOSIS — I2510 Atherosclerotic heart disease of native coronary artery without angina pectoris: Secondary | ICD-10-CM | POA: Diagnosis not present

## 2023-11-01 DIAGNOSIS — E7800 Pure hypercholesterolemia, unspecified: Secondary | ICD-10-CM | POA: Diagnosis not present

## 2023-11-01 DIAGNOSIS — E538 Deficiency of other specified B group vitamins: Secondary | ICD-10-CM | POA: Diagnosis not present

## 2023-11-01 DIAGNOSIS — M160 Bilateral primary osteoarthritis of hip: Secondary | ICD-10-CM | POA: Diagnosis not present

## 2023-11-01 DIAGNOSIS — C50412 Malignant neoplasm of upper-outer quadrant of left female breast: Secondary | ICD-10-CM | POA: Diagnosis not present

## 2023-11-01 DIAGNOSIS — E559 Vitamin D deficiency, unspecified: Secondary | ICD-10-CM | POA: Diagnosis not present

## 2023-11-02 ENCOUNTER — Inpatient Hospital Stay: Admission: RE | Admit: 2023-11-02 | Payer: Medicare HMO | Source: Ambulatory Visit

## 2023-11-06 ENCOUNTER — Ambulatory Visit (HOSPITAL_BASED_OUTPATIENT_CLINIC_OR_DEPARTMENT_OTHER)
Admission: RE | Admit: 2023-11-06 | Discharge: 2023-11-06 | Disposition: A | Discharge status: Home / Self Care | Source: Ambulatory Visit | Attending: Family Medicine | Admitting: Family Medicine

## 2023-11-06 DIAGNOSIS — R7303 Prediabetes: Secondary | ICD-10-CM | POA: Diagnosis not present

## 2023-11-06 DIAGNOSIS — M8589 Other specified disorders of bone density and structure, multiple sites: Secondary | ICD-10-CM | POA: Diagnosis not present

## 2023-11-06 DIAGNOSIS — Z780 Asymptomatic menopausal state: Secondary | ICD-10-CM | POA: Diagnosis not present

## 2023-11-06 DIAGNOSIS — M810 Age-related osteoporosis without current pathological fracture: Secondary | ICD-10-CM | POA: Diagnosis not present

## 2023-11-06 DIAGNOSIS — E042 Nontoxic multinodular goiter: Secondary | ICD-10-CM | POA: Diagnosis not present

## 2023-11-06 DIAGNOSIS — K760 Fatty (change of) liver, not elsewhere classified: Secondary | ICD-10-CM | POA: Diagnosis not present

## 2023-11-06 DIAGNOSIS — I2510 Atherosclerotic heart disease of native coronary artery without angina pectoris: Secondary | ICD-10-CM | POA: Diagnosis not present

## 2023-11-06 DIAGNOSIS — E559 Vitamin D deficiency, unspecified: Secondary | ICD-10-CM | POA: Diagnosis not present

## 2023-11-06 DIAGNOSIS — E538 Deficiency of other specified B group vitamins: Secondary | ICD-10-CM | POA: Diagnosis not present

## 2023-12-31 ENCOUNTER — Other Ambulatory Visit: Payer: Self-pay | Admitting: Nurse Practitioner

## 2023-12-31 DIAGNOSIS — Z1231 Encounter for screening mammogram for malignant neoplasm of breast: Secondary | ICD-10-CM

## 2024-01-11 DIAGNOSIS — H43812 Vitreous degeneration, left eye: Secondary | ICD-10-CM | POA: Diagnosis not present

## 2024-01-24 ENCOUNTER — Ambulatory Visit
Admission: RE | Admit: 2024-01-24 | Discharge: 2024-01-24 | Disposition: A | Discharge status: Home / Self Care | Source: Ambulatory Visit | Attending: Nurse Practitioner

## 2024-01-24 DIAGNOSIS — Z1231 Encounter for screening mammogram for malignant neoplasm of breast: Secondary | ICD-10-CM

## 2024-03-26 ENCOUNTER — Ambulatory Visit: Payer: Medicare HMO | Admitting: Hematology and Oncology

## 2024-04-07 ENCOUNTER — Other Ambulatory Visit: Payer: Self-pay

## 2024-04-07 MED ORDER — LETROZOLE 2.5 MG PO TABS: 2.5000 mg | ORAL_TABLET | Freq: Every day | ORAL | 3 refills | Status: AC

## 2024-04-08 ENCOUNTER — Telehealth: Payer: Self-pay

## 2024-04-08 NOTE — Telephone Encounter (Signed)
 Left message for patient on voicemail about upcoming appointment on 11/12

## 2024-04-09 ENCOUNTER — Encounter: Payer: Self-pay | Admitting: Hematology and Oncology

## 2024-04-09 ENCOUNTER — Inpatient Hospital Stay: Attending: Hematology and Oncology | Admitting: Hematology and Oncology

## 2024-04-09 VITALS — BP 127/87 | HR 75 | Temp 97.8°F | Resp 16 | Wt 165.7 lb

## 2024-04-09 DIAGNOSIS — Z170 Estrogen receptor positive status [ER+]: Secondary | ICD-10-CM | POA: Diagnosis not present

## 2024-04-09 DIAGNOSIS — Z79811 Long term (current) use of aromatase inhibitors: Secondary | ICD-10-CM | POA: Insufficient documentation

## 2024-04-09 DIAGNOSIS — C50412 Malignant neoplasm of upper-outer quadrant of left female breast: Secondary | ICD-10-CM | POA: Diagnosis not present

## 2024-04-09 DIAGNOSIS — Z17411 Hormone receptor positive with human epidermal growth factor receptor 2 negative status: Secondary | ICD-10-CM | POA: Insufficient documentation

## 2024-04-09 DIAGNOSIS — Z1721 Progesterone receptor positive status: Secondary | ICD-10-CM | POA: Diagnosis not present

## 2024-04-09 DIAGNOSIS — Z923 Personal history of irradiation: Secondary | ICD-10-CM | POA: Diagnosis not present

## 2024-04-09 DIAGNOSIS — Z801 Family history of malignant neoplasm of trachea, bronchus and lung: Secondary | ICD-10-CM | POA: Diagnosis not present

## 2024-04-09 DIAGNOSIS — Z79899 Other long term (current) drug therapy: Secondary | ICD-10-CM | POA: Diagnosis not present

## 2024-04-09 DIAGNOSIS — Z803 Family history of malignant neoplasm of breast: Secondary | ICD-10-CM | POA: Insufficient documentation

## 2024-04-09 DIAGNOSIS — M2550 Pain in unspecified joint: Secondary | ICD-10-CM

## 2024-04-09 NOTE — Progress Notes (Signed)
 BRIEF ONCOLOGIC HISTORY:  Oncology History  Malignant neoplasm of upper-outer quadrant of left breast in female, estrogen receptor positive (HCC)  02/14/2021 Initial Diagnosis   Malignant neoplasm of upper-outer quadrant of left breast in female, estrogen receptor positive (HCC)   02/16/2021 Cancer Staging   Staging form: Breast, AJCC 8th Edition - Clinical stage from 02/16/2021: Stage IA (cT1b, cN0, cM0, G2, ER+, PR+, HER2-) - Signed by Layla Sandria BROCKS, MD on 02/16/2021 Stage prefix: Initial diagnosis Method of lymph node assessment: Clinical Histologic grading system: 3 grade system   03/10/2021 Surgery   Left Breast Lumpectomy with sentinel lymph node mapping.   05/02/2021 - 05/31/2021 Radiation Therapy   Radiation Treatment Dates: 05/02/2021 through 05/31/2021 Site Technique Total Dose (Gy) Dose per Fx (Gy) Completed Fx Beam Energies  Breast, Left: Breast_L 3D 42.56/42.56 2.66 16/16 10XFFF  Breast, Left: Breast_L_Bst 3D 8/8 2 4/4 6X, 10X     05/2021 -  Anti-estrogen oral therapy    Letrozole  daily     INTERVAL HISTORY:   Discussed the use of AI scribe software for clinical note transcription with the patient, who gave verbal consent to proceed.  History of Present Illness  Discussed the use of AI scribe software for clinical note transcription with the patient, who gave verbal consent to proceed.  History of Present Illness Teresa Knapp is a 68 year old female with a history of breast cancer who presents for follow-up regarding her mammogram and bone density results. She is on adjuvant letrozole .  She describes her hands as 'getting tighter' and experiences generalized achiness and stiffness, particularly after prolonged sitting. Despite engaging in workouts and yoga, she continues to experience stiffness, especially in her hips.  She reports that her endocrinologist is monitoring her thyroid  nodules.  Rest of the pertinent 10 point ROS reviewed and  neg.    ONCOLOGY TREATMENT TEAM:  1. Surgeon:  Dr. Vanderbilt at [facility] 2. Medical Oncologist: Dr. Loretha  3. Radiation Oncologist: Dr. Dewey    PAST MEDICAL/SURGICAL HISTORY:  Past Medical History:  Diagnosis Date   Arthritis    Cancer [facility]    Breast cancer   Family history of adverse reaction to anesthesia    mother had a possible reaction to anesthesia (possible jerking) not sure   Personal history of radiation therapy    Past Surgical History:  Procedure Laterality Date   BIOPSY THYROID  N/A    BREAST BIOPSY Left 02/08/2021   BREAST LUMPECTOMY Left 03/09/2021   BREAST LUMPECTOMY WITH RADIOACTIVE SEED AND SENTINEL LYMPH NODE BIOPSY Left 03/10/2021   Procedure: LEFT BREAST LUMPECTOMY WITH RADIOACTIVE SEED AND SENTINEL LYMPH NODE BIOPSY;  Surgeon: Vanderbilt Ned, MD;  Location: [facility] OR;  Service: General;  Laterality: Left;   CESAREAN SECTION  1991 and 1992   CHOLECYSTECTOMY N/A 03/03/2019   Procedure: LAPAROSCOPIC CHOLECYSTECTOMY WITH INTRAOPERATIVE CHOLANGIOGRAM;  Surgeon: Eletha Boas, MD;  Location: [facility] ORS;  Service: General;  Laterality: N/A;   lipoma removal     right side of neck   UMBILICAL HERNIA REPAIR N/A 03/03/2019   Procedure: PRIMARY REPAIR OF UMBILICAL HERNIA;  Surgeon: Eletha Boas, MD;  Location: [facility];  Service: General;  Laterality: N/A;     ALLERGIES:  No Known Allergies   CURRENT MEDICATIONS:  Outpatient Encounter Medications as of 04/09/2024  Medication Sig   Cholecalciferol (VITAMIN D-3) 125 MCG (5000 UT) TABS Take by mouth.   letrozole  (FEMARA ) 2.5 MG tablet Take 1 tablet (2.5 mg total) by mouth daily.  phentermine (ADIPEX-P) 37.5 MG tablet Take 18.75-37.5 mg by mouth every morning.   rosuvastatin (CRESTOR) 10 MG tablet Take 10 mg by mouth daily.   topiramate (TOPAMAX) 25 MG tablet Take 25 mg by mouth daily.   No facility-administered encounter medications on file as of 04/09/2024.     ONCOLOGIC FAMILY HISTORY:  Family  History  Problem Relation Age of Onset   Cancer Mother        lung   Hypertension Mother    Heart attack Father    Breast cancer Maternal Grandmother    Diabetes Maternal Grandmother    Hypertension Maternal Grandmother      GENETIC COUNSELING/TESTING: Not at this time.    SOCIAL HISTORY:  Social History   Socioeconomic History   Marital status: Married    Spouse name: Not on file   Number of children: Not on file   Years of education: Not on file   Highest education level: Not on file  Occupational History   Not on file  Tobacco Use   Smoking status: Never   Smokeless tobacco: Never  Vaping Use   Vaping status: Never Used  Substance and Sexual Activity   Alcohol use: Yes    Alcohol/week: 1.0 standard drink of alcohol    Types: 1 Glasses of wine per week    Comment: SOCIAL   Drug use: Never   Sexual activity: Yes    Partners: Male    Birth control/protection: Post-menopausal    Comment: 1st intercourse- 16, partners-3, married- 33 yrs   Other Topics Concern   Not on file  Social History Narrative   Not on file   Social Drivers of Health   Financial Resource Strain: Low Risk  (02/16/2021)   Overall Financial Resource Strain (CARDIA)    Difficulty of Paying Living Expenses: Not very hard  Food Insecurity: No Food Insecurity (02/16/2021)   Hunger Vital Sign    Worried About Running Out of Food in the Last Year: Never true    Ran Out of Food in the Last Year: Never true  Transportation Needs: No Transportation Needs (02/16/2021)   PRAPARE - Administrator, Civil Service (Medical): No    Lack of Transportation (Non-Medical): No  Physical Activity: Not on file  Stress: Not on file  Social Connections: Not on file  Intimate Partner Violence: Not on file     OBSERVATIONS/OBJECTIVE:   BP 127/87 (BP Location: Right Arm, Patient Position: Sitting)   Pulse 75   Temp 97.8 F (36.6 C) (Temporal)   Resp 16   Wt 165 lb 11.2 oz (75.2 kg)   SpO2 95%    BMI 27.57 kg/m    Physical Exam Constitutional:      Appearance: Normal appearance.  Chest:     Comments:  Postradiation changes noted in the left breast.  No palpable masses.  No regional adenopathy.  Right breast normal to inspection and palpation. Musculoskeletal:     Cervical back: Normal range of motion and neck supple. No rigidity.  Lymphadenopathy:     Cervical: No cervical adenopathy.  Neurological:     Mental Status: She is alert.     LABORATORY DATA:  None for this visit.  DIAGNOSTIC IMAGING:  None for this visit.    ASSESSMENT AND PLAN:   ASSESSMENT: 68 y.o. [place] woman status post left breast upper outer quadrant biopsy 02/08/2021 for a clinical T1b N0, stage IA invasive ductal carcinoma, grade 2, estrogen and progesterone receptor positive, HER2 not  amplified, with an MIB-1 of 15%.   (1) status post left breast upper outer quadrant lumpectomy 03/10/2021 for a pT1c pN0, stage IA invasive ductal carcinoma, grade 2, with negative margins.             (a) a total of 3 left axillary lymph nodes were removed   (2) Oncotype score of 12 predicts a risk of recurrence outside the breast over the next 9 years of 3% if the patient only systemic therapy is antiestrogens for 5 years.  It also predicts no benefit from adjuvant chemotherapy.   (3) adjuvant radiation i 05/02/2021 through 05/31/2021   (4) letrozole  January 2023  (5) pulmonary nodule 0.2 cm incidentally noted on 08/24/2020 scan             (a) stable on repeat noncontrast CT of the chest 12/02/2020   (6) indeterminate thyroid  nodule biopsied 01/20/2021 showed scant follicular epithelium (Bethesda 1)             (A) repeat biopsy 03/15/2021 showed scant follicular epithelium     PLAN: Assessment & Plan Breast cancer, status post surgery and radiation Post-surgery and radiation therapy completed. Current medication causing stiffness and achiness of joints. Discussed alternative medications with similar  side effects. She is not necessarily willing to change to tamoxifen Continue letrozole  and we will continue monitoring.  Osteopenia Diagnosed with osteopenia. Recommended weight-bearing exercises and supplementation with vitamin D and calcium. - Engage in weight-bearing exercises. - Ensure adequate intake of vitamin D and calcium.  Arthralgia Stiffness and achiness possibly related to medication. Discussed alternative medications with similar side effects. She wants to continue letrozole  and monitor.  Amber Stalls MD

## 2025-04-01 ENCOUNTER — Inpatient Hospital Stay: Admitting: Hematology and Oncology
